# Patient Record
Sex: Male | Born: 1945 | Race: White | Hispanic: No | Marital: Married | Smoking: Former smoker
Health system: Southern US, Community
[De-identification: ages and names within clinical notes are randomized; demographics above are authoritative.]

## PROBLEM LIST (undated history)

## (undated) DIAGNOSIS — T7840XA Allergy, unspecified, initial encounter: Secondary | ICD-10-CM

## (undated) DIAGNOSIS — I10 Essential (primary) hypertension: Secondary | ICD-10-CM

## (undated) DIAGNOSIS — G4730 Sleep apnea, unspecified: Secondary | ICD-10-CM

## (undated) DIAGNOSIS — E785 Hyperlipidemia, unspecified: Secondary | ICD-10-CM

## (undated) DIAGNOSIS — K219 Gastro-esophageal reflux disease without esophagitis: Secondary | ICD-10-CM

## (undated) DIAGNOSIS — E119 Type 2 diabetes mellitus without complications: Secondary | ICD-10-CM

## (undated) DIAGNOSIS — M1990 Unspecified osteoarthritis, unspecified site: Secondary | ICD-10-CM

## (undated) DIAGNOSIS — I2510 Atherosclerotic heart disease of native coronary artery without angina pectoris: Secondary | ICD-10-CM

## (undated) HISTORY — DX: Type 2 diabetes mellitus without complications: E11.9

## (undated) HISTORY — DX: Allergy, unspecified, initial encounter: T78.40XA

## (undated) HISTORY — PX: TONSILLECTOMY: SUR1361

## (undated) HISTORY — PX: FRACTURE SURGERY: SHX138

## (undated) HISTORY — DX: Atherosclerotic heart disease of native coronary artery without angina pectoris: I25.10

## (undated) HISTORY — DX: Hyperlipidemia, unspecified: E78.5

## (undated) HISTORY — PX: COLONOSCOPY: SHX174

## (undated) HISTORY — PX: KNEE ARTHROSCOPY: SUR90

## (undated) HISTORY — PX: CARDIAC CATHETERIZATION: SHX172

## (undated) HISTORY — PX: OTHER SURGICAL HISTORY: SHX169

---

## 2000-02-14 ENCOUNTER — Encounter: Payer: Self-pay | Admitting: Internal Medicine

## 2000-02-14 ENCOUNTER — Ambulatory Visit (HOSPITAL_COMMUNITY): Admission: RE | Admit: 2000-02-14 | Discharge: 2000-02-14 | Payer: Self-pay | Admitting: Internal Medicine

## 2002-11-12 ENCOUNTER — Encounter: Admission: RE | Admit: 2002-11-12 | Discharge: 2002-11-12 | Payer: Self-pay | Admitting: Internal Medicine

## 2002-11-12 ENCOUNTER — Encounter: Payer: Self-pay | Admitting: Internal Medicine

## 2003-08-25 ENCOUNTER — Ambulatory Visit (HOSPITAL_COMMUNITY): Admission: RE | Admit: 2003-08-25 | Discharge: 2003-08-25 | Payer: Self-pay | Admitting: Internal Medicine

## 2004-02-09 ENCOUNTER — Encounter: Admission: RE | Admit: 2004-02-09 | Discharge: 2004-02-09 | Payer: Self-pay | Admitting: Internal Medicine

## 2006-06-22 ENCOUNTER — Ambulatory Visit: Payer: Self-pay | Admitting: Internal Medicine

## 2006-07-15 ENCOUNTER — Ambulatory Visit (HOSPITAL_BASED_OUTPATIENT_CLINIC_OR_DEPARTMENT_OTHER): Admission: RE | Admit: 2006-07-15 | Discharge: 2006-07-15 | Payer: Self-pay | Admitting: Internal Medicine

## 2006-07-22 ENCOUNTER — Ambulatory Visit: Payer: Self-pay | Admitting: Internal Medicine

## 2006-07-27 ENCOUNTER — Ambulatory Visit: Payer: Self-pay | Admitting: Internal Medicine

## 2006-08-31 ENCOUNTER — Ambulatory Visit: Payer: Self-pay | Admitting: Internal Medicine

## 2008-02-09 ENCOUNTER — Encounter: Payer: Self-pay | Admitting: Internal Medicine

## 2008-02-21 ENCOUNTER — Ambulatory Visit: Payer: Self-pay | Admitting: Internal Medicine

## 2008-02-21 DIAGNOSIS — K219 Gastro-esophageal reflux disease without esophagitis: Secondary | ICD-10-CM | POA: Insufficient documentation

## 2008-02-21 DIAGNOSIS — J3089 Other allergic rhinitis: Secondary | ICD-10-CM

## 2008-02-21 DIAGNOSIS — G4733 Obstructive sleep apnea (adult) (pediatric): Secondary | ICD-10-CM

## 2008-02-21 DIAGNOSIS — J302 Other seasonal allergic rhinitis: Secondary | ICD-10-CM

## 2008-03-23 ENCOUNTER — Telehealth (INDEPENDENT_AMBULATORY_CARE_PROVIDER_SITE_OTHER): Payer: Self-pay | Admitting: *Deleted

## 2008-08-21 ENCOUNTER — Ambulatory Visit: Payer: Self-pay | Admitting: Internal Medicine

## 2010-09-30 NOTE — Assessment & Plan Note (Signed)
[facility]                             PULMONARY OFFICE NOTE   SHAYA, ALTAMURA                   MRN:          [id number]  DATE:07/27/2006                            DOB:          09-18-1945    PROBLEM:  1. Obstructive sleep apnea.  2. Allergic rhinitis.  3. Possible esophageal reflux.   HISTORY:  He returns after his sleep study for follow up.  He was very  sensitive to extraneous noise, which I think says more about him than it  does about the sleep environment there which is really pretty quiet.  He  demonstrated moderate obstructive apnea with an index of 36.7 per hour,  moderate snoring with desaturation to 80 or 81%.  Because of his short  sleep time and delayed onset of events, he did not meet protocol  requirements for CPAP titration by split protocol.   MEDICATIONS:  1. Tenoretic.  2. Flonase.   ALLERGIES:  No medication allergy.   OBJECTIVE:  Weight 207 pounds, blood pressure 128/80, pulse regular 55,  room air saturation 96%.  He was more alert and interactive seeming on this visit.  Asking good  questions.  Nasal airway was not obstructed.  Pulse was regular, breathing unlabored.   IMPRESSION:  Moderate obstructive sleep apnea with an index of 36.7.   PLAN:  We discussed again the basics of sleep apnea, available  therapies, the importance of weight gain and his responsibility to drive  safely.  We are scheduling a CPAP titration and he will return after  that is completed.     Clinton D. Maple Hudson, MD, Tonny Bollman, FACP  Electronically Signed    CDY/MedQ  DD: 07/29/2006  DT: 07/30/2006  Job #: [id number]   cc:   Erskine Speed, M.D.

## 2010-09-30 NOTE — Assessment & Plan Note (Signed)
[facility]                             PULMONARY OFFICE NOTE   IKTAN, AIKMAN                   MRN:          [id number]  DATE:08/31/2006                            DOB:          Aug 09, 1945    PROBLEM LIST:  1. Obstructive sleep apnea.  2. Allergic rhinitis.  3. Possible esophageal reflux.   HISTORY:  He is still waiting for the home care company to switch him  from auto to fixed CPAP.  We discussed this.  With CPAP he is sleeping  more on his back.  He does admit to less daytime sleepiness.  He still  has a lifestyle habit of going to bed at 12:30 or 1 o'clock, getting up  around 8 with nocturia times 2 or 3.  Sleep position has been dictated  some by chronic knee and back pains.  He is using Flonase regularly,  trying to stay ahead of seasonal pollens.   MEDICATIONS:  1. Tenoretic.  2. Flonase.  3. CPAP.   No medication allergy.   OBJECTIVE:  Weight 211 pounds, BP 120/80, pulse regular 52, room air  saturation 94%.  He is alert.  Stocky body build.  No pressure marks on his face from the  mask.  No nasal congestion.   IMPRESSION:  Obstructive sleep apnea should respond comfortably to  continuous positive airway pressure.  His sleep is going to be affected  some by musculoskeletal pain from his back and knee problems.  Weight  loss is encouraged.   PLAN:  1. Sleep hygiene and a review of sleep therapies.  2. Will maintain for a while on continuous positive airway pressure at      11 CWP, calling for help if needed, otherwise scheduling return in      6 months.  He is given sample Astelin for trial, once each nostril      at [facility].n.     Clinton D. Maple Hudson, MD, Tonny Bollman, FACP  Electronically Signed    CDY/MedQ  DD: 08/31/2006  DT: 09/01/2006  Job #: [id number]   cc:   Erskine Speed, M.D.

## 2010-09-30 NOTE — Assessment & Plan Note (Signed)
[facility]                             PULMONARY OFFICE NOTE   Harry Leach, Harry Leach                   MRN:          [id number]  DATE:06/22/2006                            DOB:          1945/07/21    PROBLEM:  A 65 year old man referred through the courtesy of Dr. Nila Nephew in Sleep Medicine Consultation, concerned about snoring and sleep  disordered breathing.   HISTORY:  His family has been telling him for years that he snores  extremely loudly.  His wife gave up and went to a separate room quite  some time ago.  Sleep is never restful.  He is aware that he tosses and  turns, wakes briefly repeatedly through the night, and can be tired if  he has to sit quietly in the daytime.  He is not aware of apnea.  Bedtime is around 1 a.m., estimating 5 minutes before sleep onset, and  waking as many as 3 times a night, specifically for the bathroom before  finally up at 8 a.m.   MEDICATIONS:  Tenoretic, Flonase.   NO MEDICATION ALLERGY.   REVIEW OF SYSTEMS:  Snoring.  Legs sometimes feel crawling sensation  before sleep onset.  Indigestion.  Bruxism.  Mild prostatism symptoms.  Sleep position if affected by pain in arthritic knee and by sciatica.  Because of these, he tends to sleep best on his sides.   PAST HISTORY:  Tonsillectomy.  Remote allergy vaccine for seasonal  allergies.  Remote episode of pleurisy.  Hypertension.  No history of  thyroid, heart or lung disease.  Surgery x3 on his left knee.  Bothersome sciatic pain and postnasal drainage.   SOCIAL HISTORY:  Quit smoking 20 years ago.  Drinks 3 beers per week.  Married with children.  Works as an Pensions consultant.   FAMILY HISTORY:  Father with heart disease and prostate cancer.  Nobody  known to have sleep apnea.   OBJECTIVE:  Weight 207 pounds.  BP 136/80.  Pulse regular 48.  Room air  saturation 97%.  Alert, calm gentleman, stocky body build.  HEENT:  Palate spacing 2 to 3/4.  Voice  quality normal.  No thyromegaly  or stridor.  There was bilateral turbinate edema with some mucus  crusting.  CHEST:  Quiet, clear lung fields.  Unlabored breathing.  HEART SOUNDS:  Regular without murmur or gallop.  EXTREMITIES:  No tremor, cyanosis, clubbing or edema.   IMPRESSION:  1. Snoring with high probability of obstructive sleep apnea.  2. Allergic rhinitis.  3. Symptoms suggestive of esophageal reflux.   PLAN:  We are scheduled a split-protocol nocturnal polysomnogram at the  [facility] and office return to follow after  that.  We have had preliminary discussion of the basics of obstructive  sleep apnea, and the medical concerns.  I appreciate the chance to meet  him.     Clinton D. Maple Hudson, MD, Tonny Bollman, FACP  Electronically Signed    CDY/MedQ  DD: 06/23/2006  DT: 06/23/2006  Job #: [id number]   cc:   Erskine Speed, M.D.  [facility]

## 2010-09-30 NOTE — Procedures (Signed)
NAME:  Harry Leach, Harry Leach            ACCOUNT NO.:  [id number]   MEDICAL RECORD NO.:  [id number]          PATIENT TYPE:  OUT   LOCATION:  SLEEP CENTER                 FACILITY:  [facility]   PHYSICIAN:  Clinton D. Maple Hudson, MD, FCCP, FACPDATE OF BIRTH:  1945-07-04   DATE OF STUDY:  07/15/2006                            NOCTURNAL POLYSOMNOGRAM   INDICATION. FOR STUDY:  Hypersomnia with sleep apnea.   EPWORTH SLEEPINESS SCORE:   MEDICATIONS:  Flonase and atenolol, Phazyme.   SLEEP ARCHITECTURE:  Short total sleep time 104 minutes with sleep  efficiency 27%, stage I was 25%, stage II 63%, stages III and IV absent.  REM was 12% of total sleep time.  Sleep latency 57 minutes, REM latency  179 minutes, awake after sleep onset 222 minutes, arousal index 9.2.  Phazyme was taken at 3:25 a.m.  He complained of noise in his room and  wore ear muffs to block this after about 3:50 a.m.  He was awake for  most of the time between midnight and 3:50 a.m., described as restless.  He said that air/heat made a clicking noise that was disturbing and that  overall quality of sleep was much worse than usual.   RESPIRATORY DATA:  Apnea/hypopnea index (AHI, RDI) 36.7 obstructive  events per hour, indicating moderate obstructive sleep apnea/hypopnea  syndrome.  There were 52 obstructive apneas and 12 hypopneas.  Events  were not positional.  REM AHI 72.  There was insufficient sleep to  qualify for split protocol CPAP titration on this study night.   OXYGEN DATA:  Moderately loud snoring with oxygen desaturation to a  nadir of 81%.  Mean oxygen saturation through the study was 93% on room  air.   CARDIAC DATA:  Sinus rhythm with sinus bradycardia to 49 per minute.   MOVEMENT-PARASOMNIA:  No significant limb jerks or other motor  disturbance.  Bathroom x2.   IMPRESSIONS-RECOMMENDATIONS:  1. Short total sleep time.  He was disturbed by extraneous noise in      the room and restless.  2. Moderate obstructive  sleep apnea/hypopnea syndrome, apnea-hypopnea      index 36.7 per hour with non-positional events, moderately louse      snoring and oxygen desaturation to a nadir of 81%.  3. Consider continuous positive airway pressure titration and      discussion of effects of sleep disturbing factors in the home      environment.      Clinton D. Maple Hudson, MD, [facility], FACP  Diplomate, Biomedical engineer of Sleep Medicine  Electronically Signed     CDY/MEDQ  D:  07/22/2006 09:05:00  T:  07/22/2006 10:12:03  Job:  [id number]

## 2011-05-13 ENCOUNTER — Other Ambulatory Visit: Payer: Self-pay | Admitting: Orthopedic Surgery

## 2011-07-04 ENCOUNTER — Encounter (HOSPITAL_COMMUNITY): Payer: Self-pay | Admitting: Pharmacy Technician

## 2011-07-04 NOTE — H&P (Signed)
Harry Leach DOB: 07/28/1945   Chief Complaint: left knee pain  History of Present Illness The patient is a 66 year old male who comes in today for a preoperative History and Physical. The patient is scheduled for a left total knee arthroplasty to be performed by Dr. Gus Leach. [name], MD at [facility] on Thursday July 13, 2011 . The patient presented with left knee symptoms including pain which began 2 years (flare up in [place], after pivoting on a ladder). He has knee pain ,trouble with stairs, swelling (noticed initially in September) and stiffness (after tennis even with an unloader brace). Harry Leach states that the knee does hurt at most times. He is still able to play tennis once a week but he is limited in things that he would like to do more frequently. He is not getting much swelling. The knee is not giving out on him. He occasionally will have some mild right knee pain. He is not getting any lower extremity weakness or paresthesias or having any hip or back pain. He has had injections in the knee in the past. He was seen by Dr. Marzella Leach at [facility] a couple of years ago and told he had significant arthritis and would eventually need the knee replaced. AP both knees and lateral show advanced endstage arthritis of the left knee, bone on bone medial and patellofemoral, large osteophyte formation throughout. His right knee is essentially normal with just minimal medial joint space narrowing. PCP: Dr. Elmore Leach    Past Medical History  Osteoarthritis, Knee (715.96) Sleep Apnea. CPAP Hypertension Shingles Enlarged prostate  Allergies No Known Drug Allergies. 05/02/2011   Family History Cancer. father, grandmother mothers side and grandmother fathers side Cerebrovascular Accident. mother Congestive Heart Failure. father and grandmother mothers side Osteoporosis. mother Diabetes Mellitus. mother Heart Disease. father and grandfather mothers  side Hypertension. mother and father Father. multiple MI, prostate cancer. deceased age 23 Mother. Parkinson's, dementia. deceased age 66   Social History Drug/Alcohol Rehab (Previously). no Exercise. Exercises weekly; does other Illicit drug use. no Children. 2 Current work status. working full time Drug/Alcohol Rehab (Currently). no Living situation. live with spouse Tobacco / smoke exposure. no Tobacco use. former smoker; smoke(d) 1 pack(s) per week Marital status. married Number of flights of stairs before winded. 4-5 Pain Contract. no Alcohol use. current drinker; drinks beer; only occasionally per week Caregiver. wife, children Advance Directives. Living will   Medication History Atenolol-Chlorthalidone (100-25MG  Tablet, Oral) Active. Omeprazole (20mg ) Specific dose unknown - Active. Hydromet (5-1.5MG /5ML Syrup, Oral) Active. Fluticasone Propionate (50MCG/ACT Suspension, Nasal as needed) Active. Finasteride (5MG  Tablet, Oral) Active. Tamsulosin HCl (0.4MG  Capsule, Oral two times daily) Active. Azelastine HCl (137MCG/SPRAY Solution, Nasal as needed) Active. Multivitamins ( Oral) Active. Lactose Fast Acting Relief (9000UNIT Tablet, Oral) Active.   Past Surgical History Arthroscopy of Knee. 1982 left ACL, [id number], 1999 Colon Polyp Removal - Colonoscopy Tonsillectomy. 1955   Diagnostic Studies History EKG. June 2012   Review of Systems General:Not Present- Chills, Fever, Night Sweats, Fatigue, Weight Gain, Weight Loss and Memory Loss. Skin:Not Present- Hives, Itching, Rash, Eczema and Lesions. HEENT:Not Present- Tinnitus, Headache, Double Vision, Visual Loss, Hearing Loss and Dentures. Respiratory:Not Present- Shortness of breath with exertion, Shortness of breath at rest, Allergies, Coughing up blood and Chronic Cough. Cardiovascular:Not Present- Chest Pain, Racing/skipping heartbeats, Difficulty Breathing Lying Down, Murmur,  Swelling and Palpitations. Gastrointestinal:Not Present- Bloody Stool, Heartburn, Abdominal Pain, Vomiting, Nausea, Constipation, Diarrhea, Difficulty Swallowing, Jaundice and Loss of appetitie. Male Genitourinary:Present-  Nocturia. Not Present- Urinary frequency, Blood in Urine, Weak urinary stream, Discharge, Flank Pain, Incontinence, Painful Urination, Urgency, Urinary Retention and Urinating at Night. Musculoskeletal:Present- Joint Pain and Back Pain (sciatica). Not Present- Muscle Weakness, Muscle Pain, Joint Swelling, Morning Stiffness and Spasms. Neurological:Present- Tremor (rarely in finger). Not Present- Dizziness, Blackout spells, Paralysis, Difficulty with balance and Weakness. Psychiatric:Not Present- Insomnia.   Vitals 07/04/2011 3:59 PM Weight: 203 lb Height: 67 in Body Surface Area: 2.09 m Body Mass Index: 31.79 kg/m Pulse: 64 (Regular) Resp.: 18 (Unlabored) BP: 121/78 (Sitting, Left Arm, Standard)    Physical Exam General Mental Status - Alert, cooperative and good historian. General Appearance- pleasant. Not in acute distress. Orientation- Oriented X3. Build & Nutrition- Overweight, Well nourished and Well developed. Head and Neck Head- normocephalic, atraumatic . NeckGlobal Assessment- supple. no bruit auscultated on the right and no bruit auscultated on the left. Eye Pupil- Bilateral- PERRLA. Motion- Bilateral- EOMI. Chest and Lung Exam Auscultation: Breath sounds:- clear at anterior chest wall and - clear at posterior chest wall. Adventitious sounds:- No Adventitious sounds. Cardiovascular Auscultation:Rhythm- Regular rate and rhythm. Heart Sounds- S1 WNL and S2 WNL. Murmurs & Other Heart Sounds:Auscultation of the heart reveals - No Murmurs. Abdomen Palpation/Percussion:Tenderness- Abdomen is non-tender to palpation. Rigidity (guarding)- Abdomen is soft. Auscultation:Auscultation of the abdomen reveals - Bowel  sounds normal. Male Genitourinary Not done, not pertinent to present illness Neurologic Examination of related systems reveals - normal muscle strength and tone in all extremities. Neurologic evaluation reveals - normal sensation and upper and lower extremity deep tendon reflexes intact bilaterally . Musculoskeletal Right knee no effusion. Slight varus range 0 to 135. Slight crepitus on range of motion, no tenderness or instability. Left knee significant varus range 5 to 120. Marked crepitus on range of motion. Tenderness medial greater than lateral with no instability noted. Gait pattern shows slight varus thrust on the left.   Assessment & Plan Osteoarthritis, Knee (715.96) Left Knee Total Arthroplasty  Dimitri Ped, PA-C

## 2011-07-05 ENCOUNTER — Other Ambulatory Visit (HOSPITAL_COMMUNITY): Payer: BC Managed Care – PPO

## 2011-07-05 ENCOUNTER — Ambulatory Visit (HOSPITAL_COMMUNITY)
Admission: RE | Admit: 2011-07-05 | Discharge: 2011-07-05 | Disposition: A | Discharge status: Home / Self Care | Payer: BC Managed Care – PPO | Source: Ambulatory Visit | Attending: Orthopedic Surgery | Admitting: Orthopedic Surgery

## 2011-07-05 ENCOUNTER — Encounter (HOSPITAL_COMMUNITY): Payer: Self-pay

## 2011-07-05 ENCOUNTER — Encounter (HOSPITAL_COMMUNITY)
Admission: RE | Admit: 2011-07-05 | Discharge: 2011-07-05 | Disposition: A | Discharge status: Home / Self Care | Payer: BC Managed Care – PPO | Source: Ambulatory Visit | Attending: Orthopedic Surgery | Admitting: Orthopedic Surgery

## 2011-07-05 DIAGNOSIS — Z79899 Other long term (current) drug therapy: Secondary | ICD-10-CM | POA: Insufficient documentation

## 2011-07-05 DIAGNOSIS — I10 Essential (primary) hypertension: Secondary | ICD-10-CM | POA: Insufficient documentation

## 2011-07-05 DIAGNOSIS — M1710 Unilateral primary osteoarthritis, unspecified knee: Secondary | ICD-10-CM | POA: Insufficient documentation

## 2011-07-05 DIAGNOSIS — I517 Cardiomegaly: Secondary | ICD-10-CM | POA: Insufficient documentation

## 2011-07-05 DIAGNOSIS — Z01812 Encounter for preprocedural laboratory examination: Secondary | ICD-10-CM | POA: Insufficient documentation

## 2011-07-05 DIAGNOSIS — K219 Gastro-esophageal reflux disease without esophagitis: Secondary | ICD-10-CM | POA: Insufficient documentation

## 2011-07-05 DIAGNOSIS — G4730 Sleep apnea, unspecified: Secondary | ICD-10-CM | POA: Insufficient documentation

## 2011-07-05 DIAGNOSIS — Z01818 Encounter for other preprocedural examination: Secondary | ICD-10-CM | POA: Insufficient documentation

## 2011-07-05 HISTORY — DX: Sleep apnea, unspecified: G47.30

## 2011-07-05 HISTORY — DX: Unspecified osteoarthritis, unspecified site: M19.90

## 2011-07-05 HISTORY — DX: Essential (primary) hypertension: I10

## 2011-07-05 HISTORY — DX: Gastro-esophageal reflux disease without esophagitis: K21.9

## 2011-07-05 LAB — SURGICAL PCR SCREEN
MRSA, PCR: NEGATIVE
Staphylococcus aureus: NEGATIVE

## 2011-07-05 LAB — COMPREHENSIVE METABOLIC PANEL
AST: 31 U/L (ref 0–37)
Albumin: 4.2 g/dL (ref 3.5–5.2)
Calcium: 9.8 mg/dL (ref 8.4–10.5)
Chloride: 100 mEq/L (ref 96–112)
Creatinine, Ser: 1.46 mg/dL — ABNORMAL HIGH (ref 0.50–1.35)
Total Bilirubin: 0.5 mg/dL (ref 0.3–1.2)
Total Protein: 7.2 g/dL (ref 6.0–8.3)

## 2011-07-05 LAB — URINALYSIS, ROUTINE W REFLEX MICROSCOPIC
Leukocytes, UA: NEGATIVE
Nitrite: NEGATIVE
Specific Gravity, Urine: 1.028 (ref 1.005–1.030)
Urobilinogen, UA: 0.2 mg/dL (ref 0.0–1.0)
pH: 6 (ref 5.0–8.0)

## 2011-07-05 LAB — CBC
MCHC: 35.9 g/dL (ref 30.0–36.0)
MCV: 85.5 fL (ref 78.0–100.0)
Platelets: 187 10*3/uL (ref 150–400)
RDW: 13.8 % (ref 11.5–15.5)
WBC: 9.3 10*3/uL (ref 4.0–10.5)

## 2011-07-05 LAB — PROTIME-INR: INR: 0.95 (ref 0.00–1.49)

## 2011-07-05 MED ORDER — CHLORHEXIDINE GLUCONATE 4 % EX LIQD
60.0000 mL | Freq: Once | CUTANEOUS | Status: DC
  Filled 2011-07-05: qty 60

## 2011-07-05 NOTE — Pre-Procedure Instructions (Signed)
States had PLEURISY in his 28's

## 2011-07-05 NOTE — Patient Instructions (Addendum)
[name]  07/05/2011   Your procedure is scheduled on:  07/13/11   Thursday   Surgery [id number]  Report to [facility] at    1045   AM.  Call this number if you have problems the morning of surgery: [phone]     Or PST   [id number]  Janelis Stelzer              BRING CPAP MASK WITH YOU TO HOSPITAL  Remember:   Do not eat food:After Midnight.   Wednesday night  May have clear liquids: until midnight Wednesday night  Clear liquids include soda, tea, black coffee, apple or grape juice, broth.  Take these medicines the morning of surgery with A SIP OF WATER:    Prolisec with sip water  Do not wear jewelry, make-up or nail polish.  Do not wear lotions, powders, or perfumes. You may wear deodorant.  Do not shave 48 hours prior to surgery.  Do not bring valuables to the hospital.  Contacts, dentures or bridgework may not be worn into surgery.  Leave suitcase in the car. After surgery it may be brought to your room.  For patients admitted to the hospital, checkout time is 11:00 AM the day of discharge.   Patients discharged the day of surgery will not be allowed to drive home.  Name and phone number of your driver:    wife                                                                  Special Instructions: CHG Shower Use Special Wash: 1/2 bottle night before surgery and 1/2 bottle morning of surgery. REGULAR SOAP FACE AND PRIVATES                            MEN-MAY SHAVE FACE MORNING OF SURGERY  Please read over the following fact sheets that you were given: MRSA Information

## 2011-07-06 NOTE — Pre-Procedure Instructions (Signed)
0830- was transferred to [place] at [facility] ext [phone]- left message regarding abnormal CMET and request for review per provider.  Notifed Avel Peace PA of abnormal potassium, craetnine and BUN

## 2011-07-09 ENCOUNTER — Other Ambulatory Visit: Payer: Self-pay | Admitting: Orthopedic Surgery

## 2011-07-10 NOTE — Pre-Procedure Instructions (Signed)
Notified patient of surgery change date and time of OR-   To be short stay at 0915. Verbalized understanding and NPO after midnight except meds as previously instructed

## 2011-07-10 NOTE — Pre-Procedure Instructions (Signed)
Left voice mail on cell phone AM  Of surgery change and times. Left second message on cell phone and at home of the above and to veriiy he received the message.  1650 Left message with Lillia Abed at office for Mr Worrel to review messages and return my call

## 2011-07-12 ENCOUNTER — Encounter (HOSPITAL_COMMUNITY): Payer: Self-pay | Admitting: Anesthesiology

## 2011-07-12 ENCOUNTER — Inpatient Hospital Stay (HOSPITAL_COMMUNITY): Payer: BC Managed Care – PPO | Admitting: Anesthesiology

## 2011-07-12 ENCOUNTER — Encounter (HOSPITAL_COMMUNITY): Payer: Self-pay | Admitting: *Deleted

## 2011-07-12 ENCOUNTER — Encounter (HOSPITAL_COMMUNITY)
Admission: RE | Disposition: A | Discharge status: Home Health | Payer: Self-pay | Source: Ambulatory Visit | Attending: Orthopedic Surgery

## 2011-07-12 ENCOUNTER — Inpatient Hospital Stay (HOSPITAL_COMMUNITY)
Admission: RE | Admit: 2011-07-12 | Discharge: 2011-07-15 | DRG: 209 | Disposition: A | Discharge status: Home Health | Payer: BC Managed Care – PPO | Source: Ambulatory Visit | Attending: Orthopedic Surgery | Admitting: Orthopedic Surgery

## 2011-07-12 DIAGNOSIS — G4730 Sleep apnea, unspecified: Secondary | ICD-10-CM | POA: Diagnosis present

## 2011-07-12 DIAGNOSIS — Z96659 Presence of unspecified artificial knee joint: Secondary | ICD-10-CM

## 2011-07-12 DIAGNOSIS — K219 Gastro-esophageal reflux disease without esophagitis: Secondary | ICD-10-CM | POA: Diagnosis present

## 2011-07-12 DIAGNOSIS — E876 Hypokalemia: Secondary | ICD-10-CM | POA: Diagnosis not present

## 2011-07-12 DIAGNOSIS — M1710 Unilateral primary osteoarthritis, unspecified knee: Secondary | ICD-10-CM | POA: Diagnosis present

## 2011-07-12 DIAGNOSIS — D62 Acute posthemorrhagic anemia: Secondary | ICD-10-CM | POA: Diagnosis not present

## 2011-07-12 DIAGNOSIS — I10 Essential (primary) hypertension: Secondary | ICD-10-CM | POA: Diagnosis present

## 2011-07-12 HISTORY — PX: TOTAL KNEE ARTHROPLASTY: SHX125

## 2011-07-12 LAB — TYPE AND SCREEN: ABO/RH(D): O POS

## 2011-07-12 SURGERY — ARTHROPLASTY, KNEE, TOTAL
Anesthesia: Spinal | Site: Knee | Laterality: Left | Wound class: Clean

## 2011-07-12 MED ORDER — MORPHINE SULFATE (PF) 1 MG/ML IV SOLN
INTRAVENOUS | Status: DC
  Administered 2011-07-12: 15:00:00 via INTRAVENOUS
  Filled 2011-07-12: qty 25

## 2011-07-12 MED ORDER — CEFAZOLIN SODIUM 1-5 GM-% IV SOLN
1.0000 g | Freq: Four times a day (QID) | INTRAVENOUS | Status: AC
  Administered 2011-07-12 – 2011-07-13 (×3): 1 g via INTRAVENOUS
  Filled 2011-07-12 (×3): qty 50

## 2011-07-12 MED ORDER — BISACODYL 10 MG RE SUPP: 10.0000 mg | Freq: Every day | RECTAL | Status: DC | PRN

## 2011-07-12 MED ORDER — FLEET ENEMA 7-19 GM/118ML RE ENEM: 1.0000 | ENEMA | Freq: Once | RECTAL | Status: AC | PRN

## 2011-07-12 MED ORDER — WARFARIN VIDEO: Freq: Once | Status: DC

## 2011-07-12 MED ORDER — POTASSIUM CHLORIDE IN NACL 20-0.9 MEQ/L-% IV SOLN
INTRAVENOUS | Status: DC
  Administered 2011-07-13: 20 mL via INTRAVENOUS
  Administered 2011-07-13: via INTRAVENOUS
  Filled 2011-07-12 (×3): qty 1000

## 2011-07-12 MED ORDER — CEFAZOLIN SODIUM-DEXTROSE 2-3 GM-% IV SOLR
2.0000 g | Freq: Once | INTRAVENOUS | Status: AC
  Administered 2011-07-12: 2 g via INTRAVENOUS

## 2011-07-12 MED ORDER — MENTHOL 3 MG MT LOZG
1.0000 | LOZENGE | OROMUCOSAL | Status: DC | PRN
  Filled 2011-07-12: qty 9

## 2011-07-12 MED ORDER — WARFARIN SODIUM 7.5 MG PO TABS
7.5000 mg | ORAL_TABLET | Freq: Once | ORAL | Status: AC
  Administered 2011-07-12: 7.5 mg via ORAL
  Filled 2011-07-12: qty 1

## 2011-07-12 MED ORDER — SODIUM CHLORIDE 0.9 % IR SOLN
Status: DC | PRN
  Administered 2011-07-12: 1000 mL

## 2011-07-12 MED ORDER — PHENOL 1.4 % MT LIQD
1.0000 | OROMUCOSAL | Status: DC | PRN
  Filled 2011-07-12: qty 177

## 2011-07-12 MED ORDER — OXYCODONE HCL 5 MG PO TABS
5.0000 mg | ORAL_TABLET | ORAL | Status: DC | PRN
  Administered 2011-07-13 – 2011-07-15 (×10): 10 mg via ORAL
  Filled 2011-07-12 (×10): qty 2

## 2011-07-12 MED ORDER — CHLORTHALIDONE 25 MG PO TABS
25.0000 mg | ORAL_TABLET | Freq: Every day | ORAL | Status: DC
  Administered 2011-07-13 – 2011-07-15 (×3): 25 mg via ORAL
  Filled 2011-07-12 (×3): qty 1

## 2011-07-12 MED ORDER — SODIUM CHLORIDE 0.9 % IR SOLN
Status: DC | PRN
  Administered 2011-07-12: 3000 mL

## 2011-07-12 MED ORDER — DEXAMETHASONE SODIUM PHOSPHATE 10 MG/ML IJ SOLN
10.0000 mg | Freq: Once | INTRAMUSCULAR | Status: AC
  Administered 2011-07-12: 10 mg via INTRAVENOUS
  Filled 2011-07-12: qty 1

## 2011-07-12 MED ORDER — MEPERIDINE HCL 50 MG/ML IJ SOLN: 6.2500 mg | INTRAMUSCULAR | Status: DC | PRN

## 2011-07-12 MED ORDER — METHOCARBAMOL 500 MG PO TABS
500.0000 mg | ORAL_TABLET | Freq: Four times a day (QID) | ORAL | Status: DC | PRN
  Administered 2011-07-14 – 2011-07-15 (×3): 500 mg via ORAL
  Filled 2011-07-12 (×3): qty 1

## 2011-07-12 MED ORDER — HETASTARCH-ELECTROLYTES 6 % IV SOLN
INTRAVENOUS | Status: DC | PRN
  Administered 2011-07-12: 13:00:00 via INTRAVENOUS

## 2011-07-12 MED ORDER — DOCUSATE SODIUM 100 MG PO CAPS
100.0000 mg | ORAL_CAPSULE | Freq: Two times a day (BID) | ORAL | Status: DC
  Administered 2011-07-12 – 2011-07-15 (×6): 100 mg via ORAL
  Filled 2011-07-12 (×7): qty 1

## 2011-07-12 MED ORDER — BUPIVACAINE 0.25 % ON-Q PUMP DUAL CATH 300 ML
INJECTION | Status: DC | PRN
  Administered 2011-07-12: 300 mL

## 2011-07-12 MED ORDER — FLUTICASONE PROPIONATE 50 MCG/ACT NA SUSP
2.0000 | Freq: Every day | NASAL | Status: DC | PRN
  Filled 2011-07-12: qty 16

## 2011-07-12 MED ORDER — ACETAMINOPHEN 650 MG RE SUPP: 650.0000 mg | Freq: Four times a day (QID) | RECTAL | Status: DC | PRN

## 2011-07-12 MED ORDER — DIPHENHYDRAMINE HCL 12.5 MG/5ML PO ELIX: 12.5000 mg | ORAL_SOLUTION | ORAL | Status: DC | PRN

## 2011-07-12 MED ORDER — ACETAMINOPHEN 10 MG/ML IV SOLN
1000.0000 mg | Freq: Once | INTRAVENOUS | Status: AC
  Administered 2011-07-12: 1000 mg via INTRAVENOUS
  Filled 2011-07-12: qty 100

## 2011-07-12 MED ORDER — ACETAMINOPHEN 325 MG PO TABS: 650.0000 mg | ORAL_TABLET | Freq: Four times a day (QID) | ORAL | Status: DC | PRN

## 2011-07-12 MED ORDER — ONDANSETRON HCL 4 MG/2ML IJ SOLN
INTRAMUSCULAR | Status: DC | PRN
  Administered 2011-07-12: 4 mg via INTRAVENOUS

## 2011-07-12 MED ORDER — AZELASTINE HCL 0.1 % NA SOLN
1.0000 | Freq: Two times a day (BID) | NASAL | Status: DC | PRN
  Filled 2011-07-12: qty 30

## 2011-07-12 MED ORDER — LACTATED RINGERS IV SOLN: INTRAVENOUS | Status: DC

## 2011-07-12 MED ORDER — ATENOLOL 100 MG PO TABS
100.0000 mg | ORAL_TABLET | Freq: Every day | ORAL | Status: DC
  Administered 2011-07-13 – 2011-07-15 (×3): 100 mg via ORAL
  Filled 2011-07-12 (×3): qty 1

## 2011-07-12 MED ORDER — ATENOLOL-CHLORTHALIDONE 100-25 MG PO TABS: 1.0000 | ORAL_TABLET | Freq: Every day | ORAL | Status: DC

## 2011-07-12 MED ORDER — FINASTERIDE 5 MG PO TABS
5.0000 mg | ORAL_TABLET | Freq: Every day | ORAL | Status: DC
  Administered 2011-07-12 – 2011-07-15 (×4): 5 mg via ORAL
  Filled 2011-07-12 (×4): qty 1

## 2011-07-12 MED ORDER — NALOXONE HCL 0.4 MG/ML IJ SOLN: 0.4000 mg | INTRAMUSCULAR | Status: DC | PRN

## 2011-07-12 MED ORDER — SODIUM CHLORIDE 0.9 % IJ SOLN: 9.0000 mL | INTRAMUSCULAR | Status: DC | PRN

## 2011-07-12 MED ORDER — ONDANSETRON HCL 4 MG PO TABS: 4.0000 mg | ORAL_TABLET | Freq: Four times a day (QID) | ORAL | Status: DC | PRN

## 2011-07-12 MED ORDER — BUPIVACAINE ON-Q PAIN PUMP (FOR ORDER SET NO CHG)
INJECTION | Status: DC
  Filled 2011-07-12: qty 1

## 2011-07-12 MED ORDER — TEMAZEPAM 15 MG PO CAPS: 15.0000 mg | ORAL_CAPSULE | Freq: Every evening | ORAL | Status: DC | PRN

## 2011-07-12 MED ORDER — TAMSULOSIN HCL 0.4 MG PO CAPS
0.4000 mg | ORAL_CAPSULE | Freq: Two times a day (BID) | ORAL | Status: DC
Start: 2011-07-12 — End: 2011-07-15
  Administered 2011-07-12 – 2011-07-15 (×6): 0.4 mg via ORAL
  Filled 2011-07-12 (×7): qty 1

## 2011-07-12 MED ORDER — BUPIVACAINE 0.25 % ON-Q PUMP SINGLE CATH 300ML
300.0000 mL | INJECTION | Status: DC
Start: 2011-07-12 — End: 2011-07-12
  Filled 2011-07-12: qty 300

## 2011-07-12 MED ORDER — PANTOPRAZOLE SODIUM 40 MG PO TBEC
40.0000 mg | DELAYED_RELEASE_TABLET | Freq: Every day | ORAL | Status: DC
  Filled 2011-07-12 (×2): qty 1

## 2011-07-12 MED ORDER — POLYETHYLENE GLYCOL 3350 17 G PO PACK
17.0000 g | PACK | Freq: Every day | ORAL | Status: DC | PRN
  Filled 2011-07-12: qty 1

## 2011-07-12 MED ORDER — MIDAZOLAM HCL 5 MG/5ML IJ SOLN
INTRAMUSCULAR | Status: DC | PRN
  Administered 2011-07-12: 2 mg via INTRAVENOUS

## 2011-07-12 MED ORDER — CEFAZOLIN SODIUM 1-5 GM-% IV SOLN: 1.0000 g | INTRAVENOUS | Status: DC

## 2011-07-12 MED ORDER — MORPHINE SULFATE (PF) 1 MG/ML IV SOLN
INTRAVENOUS | Status: AC
  Filled 2011-07-12: qty 25

## 2011-07-12 MED ORDER — FENTANYL CITRATE 0.05 MG/ML IJ SOLN
INTRAMUSCULAR | Status: DC | PRN
  Administered 2011-07-12: 100 ug via INTRAVENOUS

## 2011-07-12 MED ORDER — COUMADIN BOOK
Freq: Once | Status: AC
  Administered 2011-07-13: 13:00:00
  Filled 2011-07-12: qty 1

## 2011-07-12 MED ORDER — ACETAMINOPHEN 10 MG/ML IV SOLN
1000.0000 mg | Freq: Four times a day (QID) | INTRAVENOUS | Status: AC
  Administered 2011-07-12 – 2011-07-13 (×4): 1000 mg via INTRAVENOUS
  Filled 2011-07-12 (×5): qty 100

## 2011-07-12 MED ORDER — DIPHENHYDRAMINE HCL 50 MG/ML IJ SOLN: 12.5000 mg | Freq: Four times a day (QID) | INTRAMUSCULAR | Status: DC | PRN

## 2011-07-12 MED ORDER — WARFARIN - PHARMACIST DOSING INPATIENT
Freq: Every day | Status: DC
  Filled 2011-07-12 (×4): qty 1

## 2011-07-12 MED ORDER — METOCLOPRAMIDE HCL 5 MG/ML IJ SOLN: 5.0000 mg | Freq: Three times a day (TID) | INTRAMUSCULAR | Status: DC | PRN

## 2011-07-12 MED ORDER — ONDANSETRON HCL 4 MG/2ML IJ SOLN: 4.0000 mg | Freq: Four times a day (QID) | INTRAMUSCULAR | Status: DC | PRN

## 2011-07-12 MED ORDER — ATENOLOL 100 MG PO TABS
100.0000 mg | ORAL_TABLET | Freq: Once | ORAL | Status: AC
  Administered 2011-07-12: 100 mg via ORAL
  Filled 2011-07-12: qty 1

## 2011-07-12 MED ORDER — LACTATED RINGERS IV SOLN
INTRAVENOUS | Status: DC
  Administered 2011-07-12: 13:00:00 via INTRAVENOUS
  Administered 2011-07-12: 1000 mL via INTRAVENOUS
  Administered 2011-07-12: 14:00:00 via INTRAVENOUS

## 2011-07-12 MED ORDER — BUPIVACAINE IN DEXTROSE 0.75-8.25 % IT SOLN
INTRATHECAL | Status: DC | PRN
  Administered 2011-07-12: 1.8 mL via INTRATHECAL

## 2011-07-12 MED ORDER — METHOCARBAMOL 100 MG/ML IJ SOLN
500.0000 mg | Freq: Four times a day (QID) | INTRAVENOUS | Status: DC | PRN
  Administered 2011-07-12: 500 mg via INTRAVENOUS
  Filled 2011-07-12: qty 5

## 2011-07-12 MED ORDER — HYDROMORPHONE HCL PF 1 MG/ML IJ SOLN: 0.2500 mg | INTRAMUSCULAR | Status: DC | PRN

## 2011-07-12 MED ORDER — PROMETHAZINE HCL 25 MG/ML IJ SOLN: 6.2500 mg | INTRAMUSCULAR | Status: DC | PRN

## 2011-07-12 MED ORDER — DIPHENHYDRAMINE HCL 12.5 MG/5ML PO ELIX
12.5000 mg | ORAL_SOLUTION | Freq: Four times a day (QID) | ORAL | Status: DC | PRN
  Filled 2011-07-12: qty 5

## 2011-07-12 MED ORDER — SODIUM CHLORIDE 0.9 % IV SOLN: INTRAVENOUS | Status: DC

## 2011-07-12 MED ORDER — PROPOFOL 10 MG/ML IV EMUL
INTRAVENOUS | Status: DC | PRN
  Administered 2011-07-12: 140 ug/kg/min via INTRAVENOUS

## 2011-07-12 MED ORDER — METOCLOPRAMIDE HCL 10 MG PO TABS: 5.0000 mg | ORAL_TABLET | Freq: Three times a day (TID) | ORAL | Status: DC | PRN

## 2011-07-12 SURGICAL SUPPLY — 54 items
BAG SPEC THK2 15X12 ZIP CLS (MISCELLANEOUS) ×1
BAG ZIPLOCK 12X15 (MISCELLANEOUS) ×2 IMPLANT
BANDAGE ELASTIC 6 VELCRO ST LF (GAUZE/BANDAGES/DRESSINGS) ×2 IMPLANT
BANDAGE ESMARK 6X9 LF (GAUZE/BANDAGES/DRESSINGS) ×1 IMPLANT
BLADE SAG 18X100X1.27 (BLADE) ×2 IMPLANT
BLADE SAW SGTL 11.0X1.19X90.0M (BLADE) ×2 IMPLANT
BNDG CMPR 9X6 STRL LF SNTH (GAUZE/BANDAGES/DRESSINGS) ×1
BNDG ESMARK 6X9 LF (GAUZE/BANDAGES/DRESSINGS) ×2
BOWL SMART MIX CTS (DISPOSABLE) ×2 IMPLANT
CATH KIT ON-Q SILVERSOAK 5 (CATHETERS) ×1 IMPLANT
CATH KIT ON-Q SILVERSOAK 5IN (CATHETERS) ×2 IMPLANT
CEMENT HV SMART SET (Cement) ×4 IMPLANT
CLOTH BEACON ORANGE TIMEOUT ST (SAFETY) ×2 IMPLANT
CUFF TOURN SGL QUICK 34 (TOURNIQUET CUFF) ×2
CUFF TRNQT CYL 34X4X40X1 (TOURNIQUET CUFF) ×1 IMPLANT
DRAPE EXTREMITY T 121X128X90 (DRAPE) ×2 IMPLANT
DRAPE POUCH INSTRU U-SHP 10X18 (DRAPES) ×2 IMPLANT
DRAPE U-SHAPE 47X51 STRL (DRAPES) ×2 IMPLANT
DRSG ADAPTIC 3X8 NADH LF (GAUZE/BANDAGES/DRESSINGS) ×2 IMPLANT
DRSG PAD ABDOMINAL 8X10 ST (GAUZE/BANDAGES/DRESSINGS) ×1 IMPLANT
DURAPREP 26ML APPLICATOR (WOUND CARE) ×2 IMPLANT
ELECT REM PT RETURN 9FT ADLT (ELECTROSURGICAL) ×2
ELECTRODE REM PT RTRN 9FT ADLT (ELECTROSURGICAL) ×1 IMPLANT
EVACUATOR 1/8 PVC DRAIN (DRAIN) ×2 IMPLANT
FACESHIELD LNG OPTICON STERILE (SAFETY) ×10 IMPLANT
GLOVE BIO SURGEON STRL SZ7.5 (GLOVE) ×2 IMPLANT
GLOVE BIO SURGEON STRL SZ8 (GLOVE) ×2 IMPLANT
GLOVE BIOGEL PI IND STRL 8 (GLOVE) ×2 IMPLANT
GLOVE BIOGEL PI INDICATOR 8 (GLOVE) ×2
GOWN STRL NON-REIN LRG LVL3 (GOWN DISPOSABLE) ×2 IMPLANT
GOWN STRL REIN XL XLG (GOWN DISPOSABLE) ×2 IMPLANT
HANDPIECE INTERPULSE COAX TIP (DISPOSABLE) ×2
IMMOBILIZER KNEE 20 (SOFTGOODS) ×2
IMMOBILIZER KNEE 20 THIGH 36 (SOFTGOODS) ×1 IMPLANT
KIT BASIN OR (CUSTOM PROCEDURE TRAY) ×2 IMPLANT
MANIFOLD NEPTUNE II (INSTRUMENTS) ×2 IMPLANT
NS IRRIG 1000ML POUR BTL (IV SOLUTION) ×2 IMPLANT
PACK TOTAL JOINT (CUSTOM PROCEDURE TRAY) ×2 IMPLANT
PAD ABD 7.5X8 STRL (GAUZE/BANDAGES/DRESSINGS) ×1 IMPLANT
PADDING CAST COTTON 6X4 STRL (CAST SUPPLIES) ×6 IMPLANT
PADDING WEBRIL 6 STERILE (GAUZE/BANDAGES/DRESSINGS) ×1 IMPLANT
POSITIONER SURGICAL ARM (MISCELLANEOUS) ×2 IMPLANT
SET HNDPC FAN SPRY TIP SCT (DISPOSABLE) ×1 IMPLANT
SPONGE GAUZE 4X4 12PLY (GAUZE/BANDAGES/DRESSINGS) ×2 IMPLANT
STRIP CLOSURE SKIN 1/2X4 (GAUZE/BANDAGES/DRESSINGS) ×4 IMPLANT
SUCTION FRAZIER 12FR DISP (SUCTIONS) ×2 IMPLANT
SUT MNCRL AB 4-0 PS2 18 (SUTURE) ×2 IMPLANT
SUT PDS AB 1 CT1 27 (SUTURE) ×6 IMPLANT
SUT VIC AB 2-0 CT1 27 (SUTURE) ×6
SUT VIC AB 2-0 CT1 TAPERPNT 27 (SUTURE) ×3 IMPLANT
TOWEL OR 17X26 10 PK STRL BLUE (TOWEL DISPOSABLE) ×4 IMPLANT
TRAY FOLEY CATH 14FRSI W/METER (CATHETERS) ×2 IMPLANT
WATER STERILE IRR 1500ML POUR (IV SOLUTION) ×2 IMPLANT
WRAP KNEE MAXI GEL POST OP (GAUZE/BANDAGES/DRESSINGS) ×2 IMPLANT

## 2011-07-12 NOTE — Anesthesia Preprocedure Evaluation (Signed)
Anesthesia Evaluation  Patient identified by MRN, date of birth, ID band Patient awake    Reviewed: Allergy & Precautions, H&P , NPO status , Patient's Chart, lab work & pertinent test results  Airway Mallampati: II TM Distance: >3 FB Neck ROM: Full    Dental No notable dental hx.    Pulmonary neg pulmonary ROS, sleep apnea and Continuous Positive Airway Pressure Ventilation ,  clear to auscultation  Pulmonary exam normal       Cardiovascular hypertension, neg cardio ROS Regular Normal    Neuro/Psych Negative Neurological ROS  Negative Psych ROS   GI/Hepatic negative GI ROS, Neg liver ROS, GERD-  Medicated and Controlled,  Endo/Other  Negative Endocrine ROS  Renal/GU negative Renal ROS  Genitourinary negative   Musculoskeletal negative musculoskeletal ROS (+)   Abdominal   Peds negative pediatric ROS (+)  Hematology negative hematology ROS (+)   Anesthesia Other Findings   Reproductive/Obstetrics negative OB ROS                           Anesthesia Physical Anesthesia Plan  ASA: II  Anesthesia Plan: Spinal   Post-op Pain Management:    Induction:   Airway Management Planned:   Additional Equipment:   Intra-op Plan:   Post-operative Plan:   Informed Consent: I have reviewed the patients History and Physical, chart, labs and discussed the procedure including the risks, benefits and alternatives for the proposed anesthesia with the patient or authorized representative who has indicated his/her understanding and acceptance.   Dental advisory given  Plan Discussed with: CRNA  Anesthesia Plan Comments:         Anesthesia Quick Evaluation

## 2011-07-12 NOTE — Op Note (Signed)
Pre-operative diagnosis- Osteoarthritis  Left knee(s)  Post-operative diagnosis- Osteoarthritis Left knee(s)  Procedure-  Left  Total Knee Arthroplasty  Surgeon- Gus Rankin. Armon Orvis, MD  Assistant- Avel Peace, PA-C   Anesthesia-  Spinal  EBL-* No blood loss amount entered *   Drains Hemovac  Tourniquet time- 39 minutes @ 300 mm Hg  Complications- None  Condition-PACU - hemodynamically stable.   Brief Clinical Note   Harry Leach is a 65 y.o. year old male with end stage OA of his left knee with progressively worsening pain and dysfunction. He has constant pain, with activity and at rest and significant functional deficits with difficulties even with ADLs. He has had extensive non-op management including analgesics, injections of cortisone, and home exercise program, but remains in significant pain with significant dysfunction. Radiographs show bone on bone arthritis medial and patellofemoral with varus deformity. He presents now for left Total Knee Arthroplasty.     Procedure in detail---   The patient is brought into the operating room and positioned supine on the operating table. After successful administration of  Spinal,   a tourniquet is placed high on the  Left thigh(s) and the lower extremity is prepped and draped in the usual sterile fashion. Time out is performed by the operating team and then the  Left lower extremity is wrapped in Esmarch, knee flexed and the tourniquet inflated to 300 mmHg.       A midline incision is made with a ten blade through the subcutaneous tissue to the level of the extensor mechanism. A fresh blade is used to make a medial parapatellar arthrotomy. Soft tissue over the proximal medial tibia is subperiosteally elevated to the joint line with a knife and into the semimembranosus bursa with a Cobb elevator. Soft tissue over the proximal lateral tibia is elevated with attention being paid to avoiding the patellar tendon on the tibial tubercle. The  patella is everted, knee flexed 90 degrees and the ACL and PCL are removed. Findings are bone on bone medial and patellofemoral with massive medial and patellar osteophytes.        The drill is used to create a starting hole in the distal femur and the canal is thoroughly irrigated with sterile saline to remove the fatty contents. The 5 degree Left  valgus alignment guide is placed into the femoral canal and the distal femoral cutting block is pinned to remove 11 mm off the distal femur. Resection is made with an oscillating saw.      The tibia is subluxed forward and the menisci are removed. The extramedullary alignment guide is placed referencing proximally at the medial aspect of the tibial tubercle and distally along the second metatarsal axis and tibial crest. The block is pinned to remove 2mm off the more deficient medial  side. Resection is made with an oscillating saw. Size 4is the most appropriate size for the tibia and the proximal tibia is prepared with the modular drill and keel punch for that size.      The femoral sizing guide is placed and size 5 is most appropriate. Rotation is marked off the epicondylar axis and confirmed by creating a rectangular flexion gap at 90 degrees. The size 5 cutting block is pinned in this rotation and the anterior, posterior and chamfer cuts are made with the oscillating saw. The intercondylar block is then placed and that cut is made.      Trial size 4 tibial component, trial size 5 posterior stabilized femur and a  12.5  mm posterior stabilized rotating platform insert trial is placed. Full extension is achieved with excellent varus/valgus and anterior/posterior balance throughout full range of motion. The patella is everted and thickness measured to be 27  mm. Free hand resection is taken to 15 mm, a 41 template is placed, lug holes are drilled, trial patella is placed, and it tracks normally. Osteophytes are removed off the posterior femur with the trial in place.  All trials are removed and the cut bone surfaces prepared with pulsatile lavage. Cement is mixed and once ready for implantation, the size 4 tibial implant, size  5 posterior stabilized femoral component, and the size 41 patella are cemented in place and the patella is held with the clamp. The trial insert is placed and the knee held in full extension. All extruded cement is removed and once the cement is hard the permanent 12.5 mm posterior stabilized rotating platform insert is placed into the tibial tray.      The wound is copiously irrigated with saline solution and the extensor mechanism closed over a hemovac drain with #1 PDS suture. The tourniquet is released for a total tourniquet time of 39  minutes. Flexion against gravity is 140 degrees and the patella tracks normally. Subcutaneous tissue is closed with 2.0 vicryl and subcuticular with running 4.0 Monocryl. The catheter for the Marcaine pain pump is placed and the pump is initiated. The incision is cleaned and dried and steri-strips and a bulky sterile dressing are applied. The limb is placed into a knee immobilizer and the patient is awakened and transported to recovery in stable condition.      Please note that a surgical assistant was a medical necessity for this procedure in order to perform it in a safe and expeditious manner. Surgical assistant was necessary to retract the ligaments and vital neurovascular structures to prevent injury to them and also necessary for proper positioning of the limb to allow for anatomic placement of the prosthesis.   Gus Rankin Timika Muench, MD    07/12/2011, 1:38 PM

## 2011-07-12 NOTE — Anesthesia Postprocedure Evaluation (Signed)
  Anesthesia Post-op Note  Patient: Harry Leach  Procedure(s) Performed: Procedure(s) (LRB): TOTAL KNEE ARTHROPLASTY (Left)  Patient Location: PACU  Anesthesia Type: SAB  Level of Consciousness: awake and alert   Airway and Oxygen Therapy: Patient Spontanous Breathing  Post-op Pain: mild  Post-op Assessment: Post-op Vital signs reviewed, Patient's Cardiovascular Status Stable, Respiratory Function Stable, Patent Airway and No signs of Nausea or vomiting  Post-op Vital Signs: stable  Complications: No apparent anesthesia complications

## 2011-07-12 NOTE — Preoperative (Signed)
Beta Blockers   Reason not to administer Beta Blockers:pt took tenoretic this am

## 2011-07-12 NOTE — Progress Notes (Signed)
ANTICOAGULATION CONSULT NOTE - Initial Consult  Pharmacy Consult for Warfarin Indication: VTE prophylaxis s/p TKA  No Known Allergies  Patient Measurements:   Last known weight 91.6 kg  Vital Signs: Temp: 97.4 F (36.3 C) (02/27 1545) Temp src: Oral (02/27 0924) BP: 113/68 mmHg (02/27 1545) Pulse Rate: 48  (02/27 1545)  Labs: No results found for this basename: HGB:2,HCT:3,PLT:3,APTT:3,LABPROT:3,INR:3,HEPARINUNFRC:3,CREATININE:3,CKTOTAL:3,CKMB:3,TROPONINI:3 in the last 72 hours The CrCl is unknown because both a height and weight (above a minimum accepted value) are required for this calculation.  Medical History: Past Medical History  Diagnosis Date  . GERD (gastroesophageal reflux disease)   . Arthritis   . Hypertension     stress test 1999/ clearance Dr Chilton Si on chart   EKG 6/12 on chart  . Sleep apnea     last study  2008- MODERATE  Dr Maple Hudson sleep study EPIC    Medications:  Scheduled:    . acetaminophen  1,000 mg Intravenous Once  . acetaminophen  1,000 mg Intravenous Q6H  . atenolol  100 mg Oral Once  . atenolol-chlorthalidone  1 tablet Oral QAC breakfast  .  ceFAZolin (ANCEF) IV  1 g Intravenous Q6H  .  ceFAZolin (ANCEF) IV  2 g Intravenous Once  . dexamethasone  10 mg Intravenous Once  . docusate sodium  100 mg Oral BID  . finasteride  5 mg Oral Daily  . morphine   Intravenous Q4H  . morphine      . pantoprazole  40 mg Oral Q1200  . Tamsulosin HCl  0.4 mg Oral BID  . DISCONTD:  ceFAZolin (ANCEF) IV  1 g Intravenous 60 min Pre-Op    Assessment:  66 yo M s/p Left Total Knee Arthroplasty  Coumadin ordered for VTE prophylaxis  No complications from procedure noted  Warfarin points = 7  Goal of Therapy:  INR 2-3   Plan:   Warfarin 7.5 mg PO today at 2000  Warfarin education, book and video  Daily INR, CBC   Nevae Pinnix, Mal Amabile 07/12/2011,4:29 PM

## 2011-07-12 NOTE — Interval H&P Note (Signed)
History and Physical Interval Note:  07/12/2011 12:13 PM  Harry Leach  has presented today for surgery, with the diagnosis of osteoarthritis left knee  The various methods of treatment have been discussed with the patient and family. After consideration of risks, benefits and other options for treatment, the patient has consented to  Procedure(s) (LRB): TOTAL KNEE ARTHROPLASTY (Left) as a surgical intervention .  The patients' history has been reviewed, patient examined, no change in status, stable for surgery.  I have reviewed the patients' chart and labs.  Questions were answered to the patient's satisfaction.     Loanne Drilling

## 2011-07-12 NOTE — Transfer of Care (Signed)
Immediate Anesthesia Transfer of Care Note  Patient: Harry Leach  Procedure(s) Performed: Procedure(s) (LRB): TOTAL KNEE ARTHROPLASTY (Left)  Patient Location: PACU  Anesthesia Type: MAC combined with regional for post-op pain  Level of Consciousness: awake, alert  and oriented  Airway & Oxygen Therapy: Patient Spontanous Breathing and Patient connected to face mask oxygen  Post-op Assessment: Report given to PACU RN and Post -op Vital signs reviewed and stable  Post vital signs: Reviewed and stable  Complications: No apparent anesthesia complications

## 2011-07-12 NOTE — Anesthesia Procedure Notes (Signed)
Spinal  Patient location during procedure: OR Staffing Anesthesiologist: Josep Luviano Performed by: anesthesiologist  Preanesthetic Checklist Completed: patient identified, site marked, surgical consent, pre-op evaluation, timeout performed, IV checked, risks and benefits discussed and monitors and equipment checked Spinal Block Patient position: sitting Prep: Betadine Patient monitoring: heart rate, continuous pulse ox and blood pressure Location: L3-4 Injection technique: single-shot Needle Needle type: Spinocan  Needle gauge: 22 G Needle length: 9 cm Additional Notes Expiration date of kit checked and confirmed. Patient tolerated procedure well, without complications.     

## 2011-07-13 DIAGNOSIS — E876 Hypokalemia: Secondary | ICD-10-CM | POA: Diagnosis not present

## 2011-07-13 LAB — BASIC METABOLIC PANEL
BUN: 19 mg/dL (ref 6–23)
Calcium: 8.8 mg/dL (ref 8.4–10.5)
Creatinine, Ser: 1.17 mg/dL (ref 0.50–1.35)
GFR calc Af Amer: 75 mL/min — ABNORMAL LOW (ref >90)
GFR calc non Af Amer: 65 mL/min — ABNORMAL LOW (ref >90)

## 2011-07-13 LAB — CBC
MCH: 29.8 pg (ref 26.0–34.0)
MCHC: 35 g/dL (ref 30.0–36.0)
MCV: 85.2 fL (ref 78.0–100.0)
Platelets: 173 10*3/uL (ref 150–400)
RDW: 13.5 % (ref 11.5–15.5)
WBC: 14.5 10*3/uL — ABNORMAL HIGH (ref 4.0–10.5)

## 2011-07-13 LAB — PROTIME-INR: Prothrombin Time: 14.7 seconds (ref 11.6–15.2)

## 2011-07-13 MED ORDER — WARFARIN SODIUM 7.5 MG PO TABS
7.5000 mg | ORAL_TABLET | Freq: Once | ORAL | Status: AC
  Administered 2011-07-13: 7.5 mg via ORAL
  Filled 2011-07-13: qty 1

## 2011-07-13 MED ORDER — OMEPRAZOLE 20 MG PO CPDR
20.0000 mg | DELAYED_RELEASE_CAPSULE | Freq: Every day | ORAL | Status: DC
  Administered 2011-07-13 – 2011-07-15 (×3): 20 mg via ORAL
  Filled 2011-07-13 (×3): qty 1

## 2011-07-13 MED ORDER — HYDROMORPHONE HCL PF 1 MG/ML IJ SOLN
0.5000 mg | INTRAMUSCULAR | Status: DC | PRN
  Administered 2011-07-13 (×2): 1 mg via INTRAVENOUS
  Filled 2011-07-13 (×2): qty 1

## 2011-07-13 MED ORDER — POTASSIUM CHLORIDE CRYS ER 20 MEQ PO TBCR
40.0000 meq | EXTENDED_RELEASE_TABLET | Freq: Every day | ORAL | Status: AC
  Administered 2011-07-13 – 2011-07-14 (×2): 40 meq via ORAL
  Filled 2011-07-13 (×2): qty 2

## 2011-07-13 MED ORDER — NON FORMULARY: 20.0000 mg | Freq: Every day | Status: DC

## 2011-07-13 NOTE — Progress Notes (Signed)
Subjective: 1 Day Post-Op Procedure(s) (LRB): TOTAL KNEE ARTHROPLASTY (Left) Patient reports pain as mild.   Patient has complaints of soreness and not much sleep last evening.  Pain appears controlled. We will start therapy today. Plan is to go home after hospital stay.  Objective: Vital signs in last 24 hours: Temp:  [97.4 F (36.3 C)-98.1 F (36.7 C)] 97.9 F (36.6 C) (02/28 1013) Pulse Rate:  [45-61] 57  (02/28 1013) Resp:  [11-19] 16  (02/28 1013) BP: (111-136)/(59-78) 120/72 mmHg (02/28 1013) SpO2:  [94 %-100 %] 97 % (02/28 1013) FiO2 (%):  [99 %] 99 % (02/28 0016) Weight:  [91.627 kg (202 lb)] 91.627 kg (202 lb) (02/27 1658)  Intake/Output from previous day:  Intake/Output Summary (Last 24 hours) at 07/13/11 1052 Last data filed at 07/13/11 0845  Gross per 24 hour  Intake 4283.5 ml  Output   1555 ml  Net 2728.5 ml    Intake/Output this shift: Total I/O In: 220 [P.O.:200; Other:20] Out: 200 [Urine:200]  Labs:  [facility] 07/13/11 0427  HGB 11.7*    Basename 07/13/11 0427  WBC 14.5*  RBC 3.92*  HCT 33.4*  PLT 173    Basename 07/13/11 0427  NA 135  K 3.4*  CL 100  CO2 30  BUN 19  CREATININE 1.17  GLUCOSE 190*  CALCIUM 8.8    Basename 07/13/11 0427  LABPT --  INR 1.13    Exam - Neurovascular intact Sensation intact distally Dressing - clean, dry, no drainage Motor function intact - moving foot and toes well on exam.  Hemovac pulled without difficulty.  Past Medical History  Diagnosis Date  . GERD (gastroesophageal reflux disease)   . Arthritis   . Hypertension     stress test 1999/ clearance Dr Chilton Si on chart   EKG 6/12 on chart  . Sleep apnea     last study  2008- MODERATE  Dr Maple Hudson sleep study EPIC    Assessment/Plan: 1 Day Post-Op Procedure(s) (LRB): TOTAL KNEE ARTHROPLASTY (Left) Principal Problem:  *OA (osteoarthritis) of knee   Advance diet Up with therapy Continue foley due to strict I&O and urinary output  monitoring Discharge home with home health  DVT Prophylaxis - Coumadin Protocol Weight-Bearing as tolerated to left leg Keep foley until tomorrow. No vaccines. D/C PCA, Change to IV push D/C O2 and Pulse OX and try on Room [address]  Harry Leach 07/13/2011, 10:52 AM

## 2011-07-13 NOTE — Evaluation (Signed)
Physical Therapy Evaluation Patient Details Name: Harry Leach MRN: [id number] DOB: 06-08-1945 Today's Date: 07/13/2011  Problem List:  Patient Active Problem List  Diagnoses  . SLEEP APNEA, OBSTRUCTIVE  . ALLERGIC RHINITIS  . G E R D  . OA (osteoarthritis) of knee  . Postop Hypokalemia    Past Medical History:  Past Medical History  Diagnosis Date  . GERD (gastroesophageal reflux disease)   . Arthritis   . Hypertension     stress test 1999/ clearance Dr Chilton Si on chart   EKG 6/12 on chart  . Sleep apnea     last study  2008- MODERATE  Dr Maple Hudson sleep study EPIC   Past Surgical History:  Past Surgical History  Procedure Date  . Knee arthroscopy     x 3   with ACL repair--  last 1999  . Tonsillectomy   . Colonoscopy     x 3/   polypectomy x 1  . Fracture surgery     arm, fingers as a teen    PT Assessment/Plan/Recommendation PT Assessment Clinical Impression Statement: Pt presents s/p L TKA POD 1 with decreased strength, ROM and mobility.  Pt tolerated ambulation well during am session.  Pt will benefit from skilled PT in acute venue to address deficits.  PT recommends HHPT at D/C for follow up therapy to return to prior level of functioning.  PT Recommendation/Assessment: Patient will need skilled PT in the acute care venue PT Problem List: Decreased strength;Decreased range of motion;Decreased activity tolerance;Decreased mobility;Decreased knowledge of use of DME;Pain Barriers to Discharge: None PT Therapy Diagnosis : Difficulty walking;Abnormality of gait;Generalized weakness;Acute pain PT Plan PT Frequency: 7X/week PT Treatment/Interventions: DME instruction;Gait training;Stair training;Functional mobility training;Therapeutic activities;Therapeutic exercise;Patient/family education PT Recommendation Recommendations for Other Services: OT consult Follow Up Recommendations: Home health PT Equipment Recommended: None recommended by PT PT Goals  Acute Rehab PT  Goals PT Goal Formulation: With patient/family Time For Goal Achievement: 4 days Pt will go Supine/Side to Sit: with supervision PT Goal: Supine/Side to Sit - Progress: Goal set today Pt will go Sit to Supine/Side: with supervision PT Goal: Sit to Supine/Side - Progress: Goal set today Pt will go Sit to Stand: with modified independence PT Goal: Sit to Stand - Progress: Goal set today Pt will go Stand to Sit: with modified independence PT Goal: Stand to Sit - Progress: Goal set today Pt will Ambulate: >150 feet;with modified independence;with least restrictive assistive device PT Goal: Ambulate - Progress: Goal set today Pt will Go Up / Down Stairs: 3-5 stairs;with least restrictive assistive device;with supervision PT Goal: Up/Down Stairs - Progress: Goal set today Pt will Perform Home Exercise Program: Independently PT Goal: Perform Home Exercise Program - Progress: Goal set today  PT Evaluation Precautions/Restrictions  Precautions Precautions: Knee Required Braces or Orthoses: Yes Knee Immobilizer: Discontinue once straight leg raise with < 10 degree lag Restrictions Weight Bearing Restrictions: Yes LLE Weight Bearing: Weight bearing as tolerated Prior Functioning  Home Living Lives With: Spouse Receives Help From: Family Home Layout: Two level;Able to live on main level with bedroom/bathroom Alternate Level Stairs-Rails: Right Alternate Level Stairs-Number of Steps: 14 Home Access: Stairs to enter Entrance Stairs-Rails: None Entrance Stairs-Number of Steps: 3 Bathroom Shower/Tub:  (tub downstairs, walk in upstairs) Home Adaptive Equipment: Walker - rolling;Bedside commode/3-in-1;Straight cane Prior Function Level of Independence: Independent with basic ADLs;Independent with gait;Independent with transfers Driving: Yes Vocation: Full time employment Cognition Cognition Arousal/Alertness: Awake/alert Overall Cognitive Status: Appears within functional limits for tasks  assessed Orientation Level: Oriented X4 Sensation/Coordination Sensation Light Touch: Appears Intact Additional Comments: Stated that sensation on top of foot was slightly different than RLE.  Coordination Gross Motor Movements are Fluid and Coordinated: Yes Extremity Assessment RLE Assessment RLE Assessment: Within Functional Limits LLE Assessment LLE Assessment: Exceptions to [facility] LLE Strength LLE Overall Strength Comments: Ankle DF/PF 5/5, able to initiate SLR (3-/5) Mobility (including Balance) Bed Mobility Bed Mobility: Yes Supine to Sit: 4: Min assist Supine to Sit Details (indicate cue type and reason): Requires assist for LLE off of bed with cues for hand placement to self assist.  Transfers Transfers: Yes Sit to Stand: 1: +2 Total assist;Patient percentage (comment);From bed;From elevated surface;With upper extremity assist Sit to Stand Details (indicate cue type and reason): Pt assist 70%.  Requires cues for hand placement on bed before standing.   Stand to Sit: 1: +2 Total assist;Patient percentage (comment);With upper extremity assist;With armrests;To chair/3-in-1 Stand to Sit Details: Pt assist 70%.  Cues for hand placement and LLE management.  Ambulation/Gait Ambulation/Gait: Yes Ambulation/Gait Assistance: 1: +2 Total assist;Patient percentage (comment) Ambulation/Gait Assistance Details (indicate cue type and reason): Pt assist 80%.  Requires cues for sequencing/technique with RW and upright posture.  Ambulation Distance (Feet): 80 Feet Assistive device: Rolling walker Gait Pattern: Step-to pattern Gait velocity: decreased    Exercise    End of Session PT - End of Session Equipment Utilized During Treatment: Gait belt;Left knee immobilizer Activity Tolerance: Patient tolerated treatment well Patient left: in chair;with call bell in reach;with family/visitor present Nurse Communication: Mobility status for transfers;Mobility status for  ambulation General Behavior During Session: [facility] for tasks performed Cognition: [facility] for tasks performed  Page, Meribeth Mattes 07/13/2011, 11:46 AM

## 2011-07-13 NOTE — Progress Notes (Signed)
ANTICOAGULATION CONSULT NOTE - Follow-Up Consult  Pharmacy Consult for Warfarin Indication: VTE prophylaxis s/p TKA  No Known Allergies  Patient Measurements: Height: 5\' 7"  (170.2 cm) Weight: 202 lb (91.627 kg) IBW/kg (Calculated) : 66.1  Last known weight 91.6 kg  Vital Signs: Temp: 97.9 F (36.6 C) (02/28 0704) Temp src: Oral (02/28 0704) BP: 128/73 mmHg (02/28 0704) Pulse Rate: 59  (02/28 0704)  Labs:  Basename 07/13/11 0427  HGB 11.7*  HCT 33.4*  PLT 173  APTT --  LABPROT 14.7  INR 1.13  HEPARINUNFRC --  CREATININE 1.17  CKTOTAL --  CKMB --  TROPONINI --   Estimated Creatinine Clearance: 69.7 ml/min (by C-G formula based on Cr of 1.17).  Medical History: Past Medical History  Diagnosis Date  . GERD (gastroesophageal reflux disease)   . Arthritis   . Hypertension     stress test 1999/ clearance Dr Chilton Si on chart   EKG 6/12 on chart  . Sleep apnea     last study  2008- MODERATE  Dr Maple Hudson sleep study EPIC    Medications:  Scheduled:     . acetaminophen  1,000 mg Intravenous Once  . acetaminophen  1,000 mg Intravenous Q6H  . atenolol  100 mg Oral Once  . tenormin  100 mg Oral QAC breakfast   And  . chlorthalidone  25 mg Oral QAC breakfast  .  ceFAZolin (ANCEF) IV  1 g Intravenous Q6H  .  ceFAZolin (ANCEF) IV  2 g Intravenous Once  . coumadin book   Does not apply Once  . dexamethasone  10 mg Intravenous Once  . docusate sodium  100 mg Oral BID  . finasteride  5 mg Oral Daily  . morphine   Intravenous Q4H  . morphine      . pantoprazole  40 mg Oral Q1200  . Tamsulosin HCl  0.4 mg Oral BID  . warfarin  7.5 mg Oral Once  . warfarin   Does not apply Once  . Warfarin - Pharmacist Dosing Inpatient   Does not apply q1800  . DISCONTD: atenolol-chlorthalidone  1 tablet Oral QAC breakfast  . DISCONTD:  ceFAZolin (ANCEF) IV  1 g Intravenous 60 min Pre-Op   Inpatient warfarin doses: 7.5mg  2/27   Assessment:  66 yo M s/p Left Total Knee  Arthroplasty  Coumadin initiation in progress for VTE prophylaxis  Goal of Therapy:  INR 2-3   Plan:   Repeat warfarin 7.5 mg PO today x 1.  Follow PT/INR daily   Elie Goody, PharmD, BCPS Pager: [phone] 07/13/2011  7:20 AM

## 2011-07-13 NOTE — Progress Notes (Signed)
Physical Therapy Treatment Patient Details Name: Harry Leach MRN: [id number] DOB: Jan 21, 1946 Today's Date: 07/13/2011  PT Assessment/Plan  PT - Assessment/Plan Comments on Treatment Session: Pt progressing well with ambulation and exercises.  Pt with some c/o soreness in back of knee.  RN aware.   PT Plan: Discharge plan remains appropriate PT Frequency: 7X/week Recommendations for Other Services: OT consult Follow Up Recommendations: Home health PT Equipment Recommended: None recommended by PT PT Goals  Acute Rehab PT Goals PT Goal Formulation: With patient/family Time For Goal Achievement: 4 days Pt will go Supine/Side to Sit: with supervision PT Goal: Supine/Side to Sit - Progress: Goal set today Pt will go Sit to Supine/Side: with supervision PT Goal: Sit to Supine/Side - Progress: Progressing toward goal Pt will go Sit to Stand: with modified independence PT Goal: Sit to Stand - Progress: Progressing toward goal Pt will go Stand to Sit: with modified independence PT Goal: Stand to Sit - Progress: Progressing toward goal Pt will Ambulate: >150 feet;with modified independence;with least restrictive assistive device PT Goal: Ambulate - Progress: Progressing toward goal Pt will Go Up / Down Stairs: 3-5 stairs;with least restrictive assistive device;with supervision PT Goal: Up/Down Stairs - Progress: Goal set today Pt will Perform Home Exercise Program: Independently PT Goal: Perform Home Exercise Program - Progress: Progressing toward goal  PT Treatment Precautions/Restrictions  Precautions Precautions: Knee Required Braces or Orthoses: Yes Knee Immobilizer: Discontinue once straight leg raise with < 10 degree lag Restrictions Weight Bearing Restrictions: Yes LLE Weight Bearing: Weight bearing as tolerated Mobility (including Balance) Bed Mobility Bed Mobility: Yes Supine to Sit: 4: Min assist Supine to Sit Details (indicate cue type and reason): Requires assist  for LLE off of bed with cues for hand placement to self assist.  Sit to Supine: 4: Min assist Sit to Supine - Details (indicate cue type and reason): Requires min assist for LLE into bed.  Pt demos good technique with hand placement.  Transfers Transfers: Yes Sit to Stand: 4: Min assist;With upper extremity assist;With armrests;From chair/3-in-1 Sit to Stand Details (indicate cue type and reason): Min/guard for safety with cues for hand placement on arm rests when standing.  Stand to Sit: 4: Min assist;With upper extremity assist;To elevated surface;To bed Stand to Sit Details: Min/guard for safety with cues for hand placement and LLE management Ambulation/Gait Ambulation/Gait: Yes Ambulation/Gait Assistance: 4: Min assist Ambulation/Gait Assistance Details (indicate cue type and reason): Min/guard for safety with cues for sequencing/technique.   Ambulation Distance (Feet): 80 Feet Assistive device: Rolling walker Gait Pattern: Step-to pattern Gait velocity: decreased    Exercise  Total Joint Exercises Ankle Circles/Pumps: AROM;Both;20 reps;Seated Quad Sets: AROM;Both;10 reps;Seated Short Arc Quad: AAROM;Left;10 reps;Seated Heel Slides: AAROM;Left;10 reps;Seated Hip ABduction/ADduction: AAROM;Left;10 reps;Seated Straight Leg Raises: AAROM;Left;10 reps;Seated End of Session PT - End of Session Equipment Utilized During Treatment: Left knee immobilizer Activity Tolerance: Patient tolerated treatment well Patient left: in bed;with call bell in reach;with family/visitor present Nurse Communication: Other (comment) (RN aware of pts request for pain meds) General Behavior During Session: [facility] for tasks performed Cognition: [facility] for tasks performed  Page, Meribeth Mattes 07/13/2011, 2:20 PM

## 2011-07-14 ENCOUNTER — Encounter (HOSPITAL_COMMUNITY): Payer: Self-pay | Admitting: Orthopedic Surgery

## 2011-07-14 LAB — CBC
MCH: 30.4 pg (ref 26.0–34.0)
MCHC: 35 g/dL (ref 30.0–36.0)
MCV: 86.6 fL (ref 78.0–100.0)
Platelets: 134 10*3/uL — ABNORMAL LOW (ref 150–400)

## 2011-07-14 LAB — PROTIME-INR
INR: 1.92 — ABNORMAL HIGH (ref 0.00–1.49)
Prothrombin Time: 22.3 seconds — ABNORMAL HIGH (ref 11.6–15.2)

## 2011-07-14 LAB — BASIC METABOLIC PANEL
Calcium: 8.6 mg/dL (ref 8.4–10.5)
Creatinine, Ser: 1.31 mg/dL (ref 0.50–1.35)
GFR calc non Af Amer: 56 mL/min — ABNORMAL LOW (ref >90)
Glucose, Bld: 160 mg/dL — ABNORMAL HIGH (ref 70–99)
Sodium: 136 mEq/L (ref 135–145)

## 2011-07-14 MED ORDER — WARFARIN SODIUM 2.5 MG PO TABS
2.5000 mg | ORAL_TABLET | Freq: Every day | ORAL | Status: DC
Start: 2011-07-14 — End: 2011-07-14
  Filled 2011-07-14: qty 1

## 2011-07-14 MED ORDER — WARFARIN VIDEO: Freq: Once | Status: DC

## 2011-07-14 MED ORDER — WARFARIN SODIUM 2.5 MG PO TABS: 2.5000 mg | ORAL_TABLET | Freq: Every day | ORAL | Status: DC

## 2011-07-14 MED ORDER — WARFARIN SODIUM 1 MG PO TABS
1.0000 mg | ORAL_TABLET | Freq: Once | ORAL | Status: DC
  Filled 2011-07-14: qty 1

## 2011-07-14 MED ORDER — WARFARIN SODIUM 2.5 MG PO TABS
2.5000 mg | ORAL_TABLET | Freq: Every day | ORAL | Status: DC
  Administered 2011-07-14: 2.5 mg via ORAL
  Filled 2011-07-14 (×2): qty 1

## 2011-07-14 MED ORDER — POLYETHYLENE GLYCOL 3350 17 G PO PACK
17.0000 g | PACK | Freq: Every day | ORAL | Status: DC | PRN
  Administered 2011-07-14: 17 g via ORAL
  Filled 2011-07-14: qty 1

## 2011-07-14 MED ORDER — OXYCODONE HCL 5 MG PO TABS: 5.0000 mg | ORAL_TABLET | ORAL | Status: AC | PRN

## 2011-07-14 MED ORDER — BISACODYL 10 MG RE SUPP: 10.0000 mg | Freq: Every day | RECTAL | Status: DC | PRN

## 2011-07-14 MED ORDER — METHOCARBAMOL 500 MG PO TABS: 500.0000 mg | ORAL_TABLET | Freq: Four times a day (QID) | ORAL | Status: AC | PRN

## 2011-07-14 NOTE — Discharge Instructions (Signed)
Knee Rehabilitation, Guidelines Following Surgery Results after knee surgery are often greatly improved when you follow the exercise, range of motion and muscle strengthening exercises prescribed by your doctor. Safety measures are also important to protect the knee from further injury. Any time any of these exercises cause you to have increased pain or swelling in your knee joint, decrease the amount until you are comfortable again and slowly increase them. If you have problems or questions, call your caregiver or physical therapist for advice. HOME CARE INSTRUCTIONS   Remove items at home which could result in a fall. This includes throw rugs or furniture in walking pathways.   Continue medications as instructed.   You may shower or take tub baths when your staples or stitches are removed or as instructed.   Walk using crutches or walker as instructed.   Put weight on your legs and walk as much as is comfortable.   You may resume a sexual relationship in one month or when given the OK by your doctor.   Return to work as instructed by your doctor.   Do not drive a car for 6 weeks or as instructed.   Wear elastic stockings until instructed not to.   Make sure you keep all of your appointments after your operation with all of your doctors and caregivers.  RANGE OF MOTION AND STRENGTHENING EXERCISES Rehabilitation of the knee is important following a knee injury or an operation. After just a few days of immobilization, the muscles of the thigh which control the knee become weakened and shrink (atrophy). Knee exercises are designed to build up the tone and strength of the thigh muscles and to improve knee motion. Often times heat used for twenty to thirty minutes before working out will loosen up your tissues and help with improving the range of motion. These exercises can be done on a training (exercise) mat, on the floor, on a table or on a bed. Use what ever works the best and is most  comfortable for you Knee exercises include:  Leg Lifts - While your knee is still immobilized in a splint or cast, you can do straight leg raises. Lift the leg to 60 degrees, hold for 3 sec, and slowly lower the leg. Repeat 10-20 times 2-3 times daily. Perform this exercise against resistance later as your knee gets better.   Quad and Hamstring Sets - Tighten up the muscle on the front of the thigh (Quad) and hold for 5-10 sec. Repeat this 10-20 times hourly. Hamstring sets are done by pushing the foot backward against an object and holding for 5-10 sec. Repeat as with quad sets.   A rehabilitation program following serious knee injuries can speed recovery and prevent re-injury in the future due to weakened muscles. Contact your doctor or a physical therapist for more information on knee rehabilitation. MAKE SURE YOU:   Understand these instructions.   Will watch your condition.   Will get help right away if you are not doing well or get worse.  Document Released: 05/01/2005 Document Revised: 01/11/2011 Document Reviewed: 10/19/2006 [employer] Patient Information 2012 [employer].  Pick up stool softner and laxative for home. Do not submerge incision under water. May shower. Continue to use ice for pain and swelling from surgery.  

## 2011-07-14 NOTE — Progress Notes (Addendum)
ANTICOAGULATION CONSULT NOTE - Follow-Up Consult  Pharmacy Consult for Warfarin Indication: VTE prophylaxis s/p TKA  No Known Allergies  Patient Measurements: Height: 5\' 7"  (170.2 cm) Weight: 202 lb (91.627 kg) IBW/kg (Calculated) : 66.1  Last known weight 91.6 kg  Vital Signs: Temp: 99.3 F (37.4 C) (03/01 0530) Temp src: Oral (03/01 0530) BP: 120/74 mmHg (03/01 0530) Pulse Rate: 68  (03/01 0530)  Labs:  Basename 07/14/11 0400 07/13/11 0427  HGB 10.9* 11.7*  HCT 31.1* 33.4*  PLT 134* 173  APTT -- --  LABPROT 22.3* 14.7  INR 1.92* 1.13  HEPARINUNFRC -- --  CREATININE 1.31 1.17  CKTOTAL -- --  CKMB -- --  TROPONINI -- --   Estimated Creatinine Clearance: 62.3 ml/min (by C-G formula based on Cr of 1.31).  Medical History: Past Medical History  Diagnosis Date  . GERD (gastroesophageal reflux disease)   . Arthritis   . Hypertension     stress test 1999/ clearance Dr Chilton Si on chart   EKG 6/12 on chart  . Sleep apnea     last study  2008- MODERATE  Dr Maple Hudson sleep study EPIC    Medications:  Scheduled:     . acetaminophen  1,000 mg Intravenous Q6H  . tenormin  100 mg Oral QAC breakfast   And  . chlorthalidone  25 mg Oral QAC breakfast  . coumadin book   Does not apply Once  . docusate sodium  100 mg Oral BID  . finasteride  5 mg Oral Daily  . omeprazole  20 mg Oral Q1200  . potassium chloride  40 mEq Oral Daily  . Tamsulosin HCl  0.4 mg Oral BID  . warfarin  7.5 mg Oral ONCE-1800  . warfarin   Does not apply Once  . Warfarin - Pharmacist Dosing Inpatient   Does not apply q1800   Inpatient warfarin doses: 7.5mg  2/27 and 2/28   Assessment:  66 yo M s/p Left Total Knee Arthroplasty  INR significantly responding s/p two doses, almost at goal  No bleeding reported  Education completed regarding warfarin indication, adverse effects, importance of INR monitoring, dietary considerations, drug-drug interactions.  He demonstrated appropriate  comprehension.  Goal of Therapy:  INR 2-3   Plan:   Decrease warfarin 2.5 mg PO today x 1.  Follow PT/INR daily  Loralee Pacas, PharmD, BCPS Pager: [phone] 07/14/2011  9:03 AM

## 2011-07-14 NOTE — Evaluation (Signed)
Occupational Therapy Evaluation Patient Details Name: Harry Leach MRN: [id number] DOB: February 03, 1946 Today's Date: 07/14/2011  Problem List:  Patient Active Problem List  Diagnoses  . SLEEP APNEA, OBSTRUCTIVE  . ALLERGIC RHINITIS  . G E R D  . OA (osteoarthritis) of knee  . Postop Hypokalemia    Past Medical History:  Past Medical History  Diagnosis Date  . GERD (gastroesophageal reflux disease)   . Arthritis   . Hypertension     stress test 1999/ clearance Dr Chilton Si on chart   EKG 6/12 on chart  . Sleep apnea     last study  2008- MODERATE  Dr Maple Hudson sleep study EPIC   Past Surgical History:  Past Surgical History  Procedure Date  . Knee arthroscopy     x 3   with ACL repair--  last 1999  . Tonsillectomy   . Colonoscopy     x 3/   polypectomy x 1  . Fracture surgery     arm, fingers as a teen    OT Assessment/Plan/Recommendation OT Assessment Clinical Impression Statement: Pt will benefit from skilled OT services to increase his independence with ADL prior to return home.  OT Recommendation/Assessment: Patient will need skilled OT in the acute care venue OT Problem List: Decreased strength;Decreased knowledge of use of DME or AE;Pain;Decreased range of motion OT Therapy Diagnosis : Generalized weakness;Acute pain OT Plan OT Frequency: Min 2X/week OT Treatment/Interventions: Self-care/ADL training;Therapeutic activities;DME and/or AE instruction;Patient/family education OT Recommendation Follow Up Recommendations: No OT follow up Equipment Recommended: 3 in 1 bedside comode Individuals Consulted Consulted and Agree with Results and Recommendations: Patient OT Goals Acute Rehab OT Goals OT Goal Formulation: With patient Time For Goal Achievement: 7 days ADL Goals Pt Will Perform Grooming: with supervision;Standing at sink ADL Goal: Grooming - Progress: Goal set today Pt Will Perform Lower Body Bathing: with supervision;Sit to stand from chair;Sit to stand  from bed ADL Goal: Lower Body Bathing - Progress: Goal set today Pt Will Perform Lower Body Dressing: with supervision;Sit to stand from chair;Sit to stand from bed ADL Goal: Lower Body Dressing - Progress: Goal set today Pt Will Transfer to Toilet: with supervision;Ambulation;with DME;3-in-1 ADL Goal: Toilet Transfer - Progress: Goal set today Pt Will Perform Toileting - Clothing Manipulation: with supervision;Standing ADL Goal: Toileting - Clothing Manipulation - Progress: Goal set today Pt Will Perform Toileting - Hygiene: with supervision;Sit to stand from 3-in-1/toilet ADL Goal: Toileting - Hygiene - Progress: Goal set today Pt Will Perform Tub/Shower Transfer: Shower transfer;with DME;with supervision;Other (comment) (if patient able to progress to go up stairs to access shower) ADL Goal: Tub/Shower Transfer - Progress: Goal set today  OT Evaluation Precautions/Restrictions  Precautions Precautions: Knee Required Braces or Orthoses: Yes Knee Immobilizer: Discontinue once straight leg raise with < 10 degree lag Restrictions Weight Bearing Restrictions: Yes LLE Weight Bearing: Weight bearing as tolerated Prior Functioning Home Living Lives With: Spouse Receives Help From: Family Home Layout: Two level;Able to live on main level with bedroom/bathroom Alternate Level Stairs-Rails: Right Alternate Level Stairs-Number of Steps: 14 Home Access: Stairs to enter Entrance Stairs-Rails: None Entrance Stairs-Number of Steps: 3 Bathroom Shower/Tub: Tub/shower unit;Walk-in shower;Other (comment) (tub downstairs, walk in upstairs) Bathroom Toilet: Standard Home Adaptive Equipment: Walker - rolling;Bedside commode/3-in-1;Straight cane Prior Function Level of Independence: Independent with basic ADLs;Independent with gait;Independent with transfers Driving: Yes Vocation: Full time employment ADL ADL Eating/Feeding: Simulated;Independent Where Assessed - Eating/Feeding: Chair Grooming:  Simulated;Set up Where Assessed - Grooming: Sitting, chair Upper  Body Bathing: Simulated;Chest;Right arm;Left arm;Abdomen;Set up Where Assessed - Upper Body Bathing: Sitting, bed;Unsupported Lower Body Bathing: Simulated;Minimal assistance Lower Body Bathing Details (indicate cue type and reason): min assist for left foot thoroughness Where Assessed - Lower Body Bathing: Sit to stand from bed Upper Body Dressing: Simulated;Set up Where Assessed - Upper Body Dressing: Sitting, bed;Unsupported Lower Body Dressing: Simulated;Moderate assistance Where Assessed - Lower Body Dressing: Sit to stand from bed Toilet Transfer: Performed;Minimal assistance Toilet Transfer Details (indicate cue type and reason): min verbal cues hand placement and positioning at 3in1 Toilet Transfer Method: Ambulating Toilet Transfer Equipment: Raised toilet seat with arms (or 3-in-1 over toilet) Toileting - Clothing Manipulation: Simulated;Minimal assistance Toileting - Clothing Manipulation Details (indicate cue type and reason): min guard assist Where Assessed - Glass blower/designer Manipulation: Standing Toileting - Hygiene: Simulated;Minimal assistance Toileting - Hygiene Details (indicate cue type and reason): min guard assist Where Assessed - Toileting Hygiene: Standing Tub/Shower Transfer: Not assessed Tub/Shower Transfer Method: Not assessed Equipment Used: Rolling walker ADL Comments: Pt doing well. Educated patient on how to correctly adjust 3in1 for appropriate height to increase his comfort with L LE positioning.  Vision/Perception    Cognition Cognition Arousal/Alertness: Awake/alert Overall Cognitive Status: Appears within functional limits for tasks assessed Orientation Level: Oriented X4 Sensation/Coordination Sensation Light Touch: Appears Intact Extremity Assessment RUE Assessment RUE Assessment: Within Functional Limits LUE Assessment LUE Assessment: Within Functional Limits Mobility    Bed Mobility Bed Mobility: Yes Supine to Sit: 4: Min assist;HOB elevated (Comment degrees) Supine to Sit Details (indicate cue type and reason): assist to support L LE off the bed.  Transfers Sit to Stand: 4: Min assist;From elevated surface;From bed;With upper extremity assist Sit to Stand Details (indicate cue type and reason): min guard assist min verbal cues Stand to Sit: 4: Min assist;With upper extremity assist;To chair/3-in-1 Stand to Sit Details: min guard assist with min verbal cues hand placement Exercises   End of Session OT - End of Session Activity Tolerance: Patient tolerated treatment well Patient left: in chair;with call bell in reach General Behavior During Session: [facility] for tasks performed Cognition: [facility] for tasks performed   Lennox Laity [phone] 07/14/2011, 9:46 AM

## 2011-07-14 NOTE — Progress Notes (Signed)
CARE MANAGEMENT NOTE 07/14/2011  Patient:  Harry Leach, Harry Leach   Account Number:  [id number]  Date Initiated:  07/14/2011  Documentation initiated by:  Colleen Can  Subjective/Objective Assessment:   DX OSTEOARTHRITIS LEFT KNEE; total knee replacemnt on day of admission     Action/Plan:   CM spoke with patient. Plans are for patient to return to his home in [place] where his spouse will be caregiver. Already has RW and 3N1 has been delivered to pt's room. Pt offered choice and wants [facility] for [facility] services.   Anticipated DC Date:  07/15/2011   Anticipated DC Plan:  HOME W HOME HEALTH SERVICES  In-house referral  NA      DC Planning Services  CM consult      [facility] Choice  HOME HEALTH   Choice offered to / List presented to:  C-1 Patient   DME arranged  NA      DME agency  NA     HH arranged  HH-1 RN  HH-2 PT      [facility] agency  [facility]   Status of service:  Completed, signed off Medicare Important Message given?  NO (If response is "NO", the following Medicare IM given date fields will be blank) Date Medicare IM given:   Date Additional Medicare IM given:    Discharge Disposition:    Per UR Regulation:    Comments:  07/14/2011 [facility] will provide HHrn and HHpt, coumadin managemnt. [facility] arranged for DME. Anticipate d/c Saturday 03/02. HH services to start 07/16/2011. list of HH agencies placed in shaqdow chart.

## 2011-07-14 NOTE — Progress Notes (Signed)
Physical Therapy Treatment Patient Details Name: Harry Leach MRN: [id number] DOB: June 13, 1945 Today's Date: 07/14/2011  PT Assessment/Plan  PT - Assessment/Plan Comments on Treatment Session: pt progressing well, frustrated about his hiccups. PT Plan: Discharge plan remains appropriate PT Frequency: 7X/week Follow Up Recommendations: Home health PT Equipment Recommended: 3 in 1 bedside comode PT Goals  Acute Rehab PT Goals Pt will go Supine/Side to Sit: with supervision PT Goal: Supine/Side to Sit - Progress: Progressing toward goal Pt will go Sit to Supine/Side: with supervision PT Goal: Sit to Supine/Side - Progress: Progressing toward goal Pt will go Sit to Stand: with modified independence PT Goal: Sit to Stand - Progress: Progressing toward goal Pt will go Stand to Sit: with modified independence PT Goal: Stand to Sit - Progress: Progressing toward goal Pt will Ambulate: >150 feet;with modified independence;with least restrictive assistive device PT Goal: Ambulate - Progress: Progressing toward goal Pt will Go Up / Down Stairs: 3-5 stairs;with least restrictive assistive device;with supervision PT Goal: Up/Down Stairs - Progress: Progressing toward goal  PT Treatment Precautions/Restrictions  Precautions Precautions: Knee Required Braces or Orthoses: Yes Knee Immobilizer: Discontinue once straight leg raise with < 10 degree lag Restrictions Weight Bearing Restrictions: Yes LLE Weight Bearing: Weight bearing as tolerated Mobility (including Balance) Bed Mobility Supine to Sit: 4: Min assist;HOB flat Supine to Sit Details (indicate cue type and reason): assist to support L LE off the bed Sit to Supine: 4: Min assist Sit to Supine - Details (indicate cue type and reason): Requires min assist for LLE into bed. Pt demos good technique with hand placement Transfers Transfers: Yes Sit to Stand: 5: Supervision;From bed;With upper extremity assist Sit to Stand Details  (indicate cue type and reason): vc for hand placement Stand to Sit: 5: Supervision;To bed;With upper extremity assist Stand to Sit Details: vc to step LLE forward. Ambulation/Gait Ambulation/Gait: Yes Ambulation/Gait Assistance: 5: Supervision Ambulation/Gait Assistance Details (indicate cue type and reason): vc for rolling RW  Ambulation Distance (Feet): 200 Feet Assistive device: Rolling walker Gait Pattern: Step-to pattern Gait velocity: decreased Stairs: Yes Stairs Assistance: 4: Min assist Stairs Assistance Details (indicate cue type and reason): pt practiced 3 steps w/ 1 crutch/1 rail then 2 steps w/ 2 crutches., vc for sequence. Stair Management Technique: No rails;One rail Left;Forwards;With crutches Number of Stairs: 2  (then 3) Height of Stairs: 6  (4)    Exercise  Total Joint Exercises Ankle Circles/Pumps: AROM;Both;20 reps;Seated Quad Sets: AROM;Both;10 reps;Seated Heel Slides: AAROM;Left;10 reps;Seated Hip ABduction/ADduction: AAROM;Left;10 reps;Seated Straight Leg Raises: AAROM;Left;10 reps;Seated End of Session PT - End of Session Equipment Utilized During Treatment: Left knee immobilizer Activity Tolerance: Patient tolerated treatment well Patient left: in bed;with call bell in reach General Behavior During Session: [facility] for tasks performed Cognition: [facility] for tasks performed  Rada Hay 07/14/2011, 3:48 PM

## 2011-07-14 NOTE — Progress Notes (Signed)
Subjective: 2 Days Post-Op Procedure(s) (LRB): TOTAL KNEE ARTHROPLASTY (Left) Patient reports pain as mild and moderate.   Patient has complaints of moderate pain yesterday but better today.  More comfortable. Pain meds are helping.  Probably home tomorrow.  Objective: Vital signs in last 24 hours: Temp:  [98.4 F (36.9 C)-99.3 F (37.4 C)] 99.3 F (37.4 C) (03/01 0530) Pulse Rate:  [58-68] 68  (03/01 0530) Resp:  [16-18] 16  (03/01 0530) BP: (102-129)/(45-77) 120/74 mmHg (03/01 0530) SpO2:  [96 %-99 %] 96 % (03/01 0530)  Intake/Output from previous day:  Intake/Output Summary (Last 24 hours) at 07/14/11 1045 Last data filed at 07/14/11 0530  Gross per 24 hour  Intake    360 ml  Output   1680 ml  Net  -1320 ml    Intake/Output this shift:    Labs:  Basename 07/14/11 0400 07/13/11 0427  HGB 10.9* 11.7*    Basename 07/14/11 0400 07/13/11 0427  WBC 11.3* 14.5*  RBC 3.59* 3.92*  HCT 31.1* 33.4*  PLT 134* 173    Basename 07/14/11 0400 07/13/11 0427  NA 136 135  K 3.4* 3.4*  CL 99 100  CO2 32 30  BUN 17 19  CREATININE 1.31 1.17  GLUCOSE 160* 190*  CALCIUM 8.6 8.8    Basename 07/14/11 0400 07/13/11 0427  LABPT -- --  INR 1.92* 1.13    Exam - Neurovascular intact Sensation intact distally Dressing/Incision - clean, dry, no drainage Motor function intact - moving foot and toes well on exam.   Past Medical History  Diagnosis Date  . GERD (gastroesophageal reflux disease)   . Arthritis   . Hypertension     stress test 1999/ clearance Dr Chilton Si on chart   EKG 6/12 on chart  . Sleep apnea     last study  2008- MODERATE  Dr Maple Hudson sleep study EPIC    Assessment/Plan: 2 Days Post-Op Procedure(s) (LRB): TOTAL KNEE ARTHROPLASTY (Left) Principal Problem:  *OA (osteoarthritis) of knee Active Problems:  Postop Hypokalemia   Advance diet Up with therapy D/C IV fluids Plan for discharge tomorrow Discharge home with home health  DVT Prophylaxis -  Coumadin Protocol - INR 1.92 today. Should be good from that standpoint for home.  3 weeks of coumadin for DVT Prophylaxis. Weight-Bearing as tolerated to left leg  Arlando Leisinger 07/14/2011, 10:45 AM

## 2011-07-14 NOTE — Progress Notes (Signed)
Physical Therapy Treatment Patient Details Name: CHANCELLOR VANDERLOOP MRN: [id number] DOB: 1945/10/20 Today's Date: 07/14/2011  PT Assessment/Plan  PT - Assessment/Plan Comments on Treatment Session: pt progressing well, frustrated about his hiccups. PT Plan: Discharge plan remains appropriate PT Frequency: 7X/week Follow Up Recommendations: Home health PT Equipment Recommended: 3 in 1 bedside comode PT Goals  Acute Rehab PT Goals Pt will go Sit to Stand: with modified independence PT Goal: Sit to Stand - Progress: Progressing toward goal Pt will go Stand to Sit: with modified independence PT Goal: Stand to Sit - Progress: Progressing toward goal Pt will Ambulate: >150 feet;with modified independence;with least restrictive assistive device PT Goal: Ambulate - Progress: Progressing toward goal  PT Treatment Precautions/Restrictions  Precautions Precautions: Knee Required Braces or Orthoses: Yes Knee Immobilizer: Discontinue once straight leg raise with < 10 degree lag Restrictions Weight Bearing Restrictions: Yes LLE Weight Bearing: Weight bearing as tolerated Mobility (including Balance) Bed Mobility Bed Mobility: Yes Supine to Sit: 4: Min assist;HOB elevated (Comment degrees) Supine to Sit Details (indicate cue type and reason): assist to support L LE off the bed.  Transfers Sit to Stand: 4: Min assist;5: Supervision;With armrests;From chair/3-in-1 Sit to Stand Details (indicate cue type and reason): Min/guard for safety with cues for hand placement on arm rests when standing.  Stand to Sit: 4: Min assist;5: Supervision;With armrests;To chair/3-in-1 Stand to Sit Details: Min/guard for safety with cues for hand placement and LLE management Ambulation/Gait Ambulation/Gait Assistance: 4: Min assist;5: Supervision Ambulation/Gait Assistance Details (indicate cue type and reason): Min/guard for safety with cues for sequencing/technique.  Ambulation Distance (Feet): 120  Feet Assistive device: Rolling walker Gait Pattern: Step-to pattern Gait velocity: decreased    Exercise  Total Joint Exercises Ankle Circles/Pumps: AROM;Both;20 reps;Seated Quad Sets: AROM;Both;10 reps;Seated Heel Slides: AAROM;Left;10 reps;Seated Hip ABduction/ADduction: AAROM;Left;10 reps;Seated Straight Leg Raises: AAROM;Left;10 reps;Seated End of Session PT - End of Session Equipment Utilized During Treatment: Left knee immobilizer Activity Tolerance: Patient tolerated treatment well Patient left: in bed;with call bell in reach;with family/visitor present General Behavior During Session: [facility] for tasks performed Cognition: [facility] for tasks performed  [facility] 07/14/2011, 1:35 PM

## 2011-07-15 LAB — BASIC METABOLIC PANEL
Chloride: 99 mEq/L (ref 96–112)
GFR calc Af Amer: 68 mL/min — ABNORMAL LOW (ref >90)
GFR calc non Af Amer: 59 mL/min — ABNORMAL LOW (ref >90)
Potassium: 3.5 mEq/L (ref 3.5–5.1)
Sodium: 135 mEq/L (ref 135–145)

## 2011-07-15 LAB — CBC
Hemoglobin: 10.8 g/dL — ABNORMAL LOW (ref 13.0–17.0)
MCH: 30.1 pg (ref 26.0–34.0)
MCV: 86.6 fL (ref 78.0–100.0)
Platelets: 136 10*3/uL — ABNORMAL LOW (ref 150–400)
RBC: 3.59 MIL/uL — ABNORMAL LOW (ref 4.22–5.81)
WBC: 8.8 10*3/uL (ref 4.0–10.5)

## 2011-07-15 LAB — PROTIME-INR: Prothrombin Time: 25.3 seconds — ABNORMAL HIGH (ref 11.6–15.2)

## 2011-07-15 MED ORDER — CHLORPROMAZINE HCL 25 MG PO TABS: 25.0000 mg | ORAL_TABLET | Freq: Three times a day (TID) | ORAL | Status: DC

## 2011-07-15 MED ORDER — CHLORPROMAZINE HCL 25 MG PO TABS
25.0000 mg | ORAL_TABLET | Freq: Once | ORAL | Status: AC
  Administered 2011-07-15: 25 mg via ORAL
  Filled 2011-07-15: qty 1

## 2011-07-15 NOTE — Progress Notes (Signed)
Occupational Therapy Treatment Patient Details Name: Harry Leach MRN: [id number] DOB: 06/13/1945 Today's Date: 07/15/2011  OT Assessment/Plan OT Assessment/Plan OT Plan: Discharge plan remains appropriate OT Frequency: Min 2X/week Follow Up Recommendations: No OT follow up Equipment Recommended: 3 in 1 bedside comode OT Goals ADL Goals Pt Will Perform Lower Body Bathing: with supervision;Sit to stand from chair;Sit to stand from bed ADL Goal: Lower Body Bathing - Progress: Progressing toward goals Pt Will Perform Lower Body Dressing: with supervision;Sit to stand from chair;Sit to stand from bed ADL Goal: Lower Body Dressing - Progress: Progressing toward goals Pt Will Transfer to Toilet: with supervision;Ambulation;with DME;3-in-1 ADL Goal: Toilet Transfer - Progress: Met Pt Will Perform Toileting - Clothing Manipulation: with supervision;Standing;Independently ADL Goal: Toileting - Clothing Manipulation - Progress: Met Pt Will Perform Tub/Shower Transfer: Shower transfer;with DME;with supervision;Other (comment) ADL Goal: Tub/Shower Transfer - Progress: Met  OT Treatment Precautions/Restrictions  Precautions Precautions: Knee Knee Immobilizer: Discontinue once straight leg raise with < 10 degree lag Restrictions LLE Weight Bearing: Weight bearing as tolerated   ADL ADL Lower Body Bathing: Performed;Minimal assistance;Other (comment) (pt wanted to wash up but felt "puny":  wife will assist ) Where Assessed - Lower Body Bathing: Sit to stand from chair;Other (comment) (3:1) Lower Body Dressing: Moderate assistance;Performed;Other (comment) (pants and underwear:  no AE) Where Assessed - Lower Body Dressing: Sit to stand from chair;Other (comment) (3:1) Toilet Transfer: Research scientist (life sciences) Method: Proofreader: Raised toilet seat with arms (or 3-in-1 over toilet) Toileting - Clothing Manipulation:  Performed;Supervision/safety Where Assessed - Toileting Clothing Manipulation: Sit to stand from 3-in-1 or toilet Tub/Shower Transfer: Performed;Supervision/safety Tub/Shower Transfer Method: Science writer: Walk in Scientist, research (physical sciences) Used: Rolling walker Ambulation Related to ADLs: min guard ADL Comments: wanted to wash up after bathroom transfers but felt "puny" Mobility  Bed Mobility Supine to Sit: 4: Min assist;HOB flat;With rails Transfers Sit to Stand: 5: Supervision;From bed;From chair/3-in-1;With upper extremity assist Exercises    End of Session OT - End of Session Activity Tolerance: Other (comment) (felt middle of session) Patient left: in chair;with call bell in reach;with family/visitor present General Behavior During Session:  [facility] for tasks performed Cognition: [facility] for tasks performed  [facility]  07/15/2011, 10:30 AM

## 2011-07-15 NOTE — Progress Notes (Signed)
ANTICOAGULATION CONSULT NOTE - Follow-Up Consult  Pharmacy Consult for Warfarin Indication: VTE prophylaxis s/p TKA  No Known Allergies  Patient Measurements: Height: 5\' 7"  (170.2 cm) Weight: 202 lb (91.627 kg) IBW/kg (Calculated) : 66.1   Vital Signs: Temp: 99.9 F (37.7 C) (03/02 0619) BP: 123/77 mmHg (03/02 0619) Pulse Rate: 68  (03/02 0619)  Labs:  Basename 07/15/11 0429 07/14/11 0400 07/13/11 0427  HGB 10.8* 10.9* --  HCT 31.1* 31.1* 33.4*  PLT 136* 134* 173  APTT -- -- --  LABPROT 25.3* 22.3* 14.7  INR 2.26* 1.92* 1.13  HEPARINUNFRC -- -- --  CREATININE 1.26 1.31 1.17  CKTOTAL -- -- --  CKMB -- -- --  TROPONINI -- -- --   Estimated Creatinine Clearance: 64.8 ml/min (by C-G formula based on Cr of 1.26).  Medical History: Past Medical History  Diagnosis Date  . GERD (gastroesophageal reflux disease)   . Arthritis   . Hypertension     stress test 1999/ clearance Dr Chilton Si on chart   EKG 6/12 on chart  . Sleep apnea     last study  2008- MODERATE  Dr Maple Hudson sleep study EPIC    Medications:  Scheduled:     . tenormin  100 mg Oral QAC breakfast   And  . chlorthalidone  25 mg Oral QAC breakfast  . docusate sodium  100 mg Oral BID  . finasteride  5 mg Oral Daily  . omeprazole  20 mg Oral Q1200  . Tamsulosin HCl  0.4 mg Oral BID  . warfarin  2.5 mg Oral q1800  . warfarin   Does not apply Once  . warfarin   Does not apply Once  . Warfarin - Pharmacist Dosing Inpatient   Does not apply q1800   Inpatient warfarin doses: 2/27 7.5mg , 2/28 7.5mg , 3/1 2.5mg    Assessment:  66 yo M s/p Left Total Knee Arthroplasty  INR therapeutic after 3 doses, has significantly responded  No bleeding reported  Education completed regarding warfarin  Goal of Therapy:  INR 2-3   Plan:   Repeat warfarin 2.5 mg PO today x 1.  Daily INR, CBC   Lynann Beaver PharmD  Pager [phone] 07/15/2011 11:38 AM

## 2011-07-15 NOTE — Progress Notes (Signed)
Patient discharged to home. Dc instructions given with wife at bedside. No concerns voiced. One-time order for thorazine carried out. Med given to patient prior to DC. Prescriptions x 4 given to patient at DC. Left unit in wheelchair pushed by nurse tech. Left in good condition. Vwilliams, rn.

## 2011-07-15 NOTE — Progress Notes (Signed)
Subjective: 3 Days Post-Op Procedure(s) (LRB): TOTAL KNEE ARTHROPLASTY (Left)   Patient reports pain as mild. C/O hiccups, but otherwise no events or issues. Ready to be discharged home with HHPT.  Objective:   VITALS:   Filed Vitals:   07/15/11 0619  BP: 123/77  Pulse: 68  Temp: 99.9 F (37.7 C)  Resp: 16    Neurovascular intact Dorsiflexion/Plantar flexion intact Incision: dressing C/D/I No cellulitis present Compartment soft  LABS  Basename 07/15/11 0429 07/14/11 0400 07/13/11 0427  HGB 10.8* 10.9* 11.7*  HCT 31.1* 31.1* 33.4*  WBC 8.8 11.3* 14.5*  PLT 136* 134* 173     Basename 07/15/11 0429 07/14/11 0400 07/13/11 0427  NA 135 136 135  K 3.5 3.4* 3.4*  BUN 16 17 19   CREATININE 1.26 1.31 1.17  GLUCOSE 120* 160* 190*     Assessment/Plan: 3 Days Post-Op Procedure(s) (LRB): TOTAL KNEE ARTHROPLASTY (Left)   Up with therapy Discharge home with home health today Follow up as instructed   Anastasio Auerbach. Lynann Demetrius   PAC  07/15/2011, 8:29 AM

## 2011-07-15 NOTE — Progress Notes (Signed)
Cm spoke with patient concerning d/c planning. Per pt [facility] to provide [facility] services. [name] DME present in room during interview. Wife at bedside to assist in home care. Cm to alert [facility] of pt's discharge.  Harry Leach [phone]

## 2011-07-15 NOTE — Progress Notes (Signed)
Patient with severe hiccups x 4 days.PA notified. N.O given. Vwilliams,rn.

## 2011-07-15 NOTE — Progress Notes (Signed)
Physical Therapy Treatment Patient Details Name: Harry Leach MRN: [id number] DOB: 19-Feb-1946 Today's Date: 07/15/2011  PT Assessment/Plan  PT - Assessment/Plan Comments on Treatment Session: pt progressing well, frustrated about his hiccups. PT Plan: Discharge plan remains appropriate PT Frequency: 7X/week Follow Up Recommendations: Home health PT Equipment Recommended:  (Has DME) PT Goals  Acute Rehab PT Goals Pt will go Sit to Stand: with modified independence PT Goal: Sit to Stand - Progress: Met Pt will go Stand to Sit: with modified independence PT Goal: Stand to Sit - Progress: Met Pt will Ambulate: >150 feet;with modified independence;with least restrictive assistive device PT Goal: Ambulate - Progress: Progressing toward goal Pt will Go Up / Down Stairs: 3-5 stairs;with least restrictive assistive device;with supervision PT Goal: Up/Down Stairs - Progress: Partly met Pt will Perform Home Exercise Program: Independently PT Goal: Perform Home Exercise Program - Progress: Partly met  PT Treatment Precautions/Restrictions  Precautions Precautions: Knee Required Braces or Orthoses: Yes Knee Immobilizer: Discontinue once straight leg raise with < 10 degree lag Restrictions Weight Bearing Restrictions: Yes LLE Weight Bearing: Weight bearing as tolerated Mobility (including Balance) Bed Mobility Supine to Sit: 4: Min assist;HOB flat;With rails Transfers Sit to Stand: 6: Modified independent (Device/Increase time);With armrests;From chair/3-in-1;With upper extremity assist Stand to Sit: 6: Modified independent (Device/Increase time);With armrests;To chair/3-in-1;With upper extremity assist Ambulation/Gait Ambulation/Gait Assistance: 5: Supervision Ambulation/Gait Assistance Details (indicate cue type and reason): cues for RW placement Ambulation Distance (Feet): 130 Feet Assistive device: Rolling walker Gait Pattern: Step-to pattern Gait velocity: decreased Stairs:  Yes Stairs Assistance: 4: Min assist Stairs Assistance Details (indicate cue type and reason): cues for sequence Stair Management Technique: No rails;Forwards;Step to pattern;With crutches Number of Stairs: 5     Exercise  Total Joint Exercises Ankle Circles/Pumps: AROM;Both;20 reps;Seated Quad Sets: AROM;Both;10 reps;Seated Heel Slides: AAROM;Left;10 reps;Seated Hip ABduction/ADduction: AAROM;Left;10 reps;Seated Straight Leg Raises: AAROM;Left;10 reps;Seated End of Session PT - End of Session Equipment Utilized During Treatment: Left knee immobilizer Activity Tolerance: Patient tolerated treatment well Patient left: in chair;with call bell in reach;with family/visitor present Nurse Communication: Other (comment) (Hiccup issue; RN called MD regarding meds) General Behavior During Session: [facility] for tasks performed Cognition: [facility] for tasks performed  [facility] 07/15/2011, 11:13 AM

## 2011-07-20 DIAGNOSIS — D62 Acute posthemorrhagic anemia: Secondary | ICD-10-CM | POA: Diagnosis not present

## 2011-07-20 NOTE — Discharge Summary (Signed)
Physician Discharge Summary   Patient ID: [name] MRN: [id number] DOB/AGE: [birth year] 66 y.o.  Admit date: 07/12/2011 Discharge date: 07/15/2011  Primary Diagnosis: Osteoarthritis Left Knee   Admission Diagnoses: Past Medical History  Diagnosis Date  . GERD (gastroesophageal reflux disease)   . Arthritis   . Hypertension     stress test 1999/ clearance Dr Chilton Si on chart   EKG 6/12 on chart  . Sleep apnea     last study  2008- MODERATE  Dr Maple Hudson sleep study EPIC   Discharge Diagnoses:  Principal Problem:  *OA (osteoarthritis) of knee Active Problems:  Postop Hypokalemia  Postop Acute blood loss anemia  Procedure: Procedure(s) (LRB): TOTAL KNEE ARTHROPLASTY (Left)   Consults: None  HPI: Harry Leach is a 66 y.o. year old male with end stage OA of his left knee with progressively worsening pain and dysfunction. He has constant pain, with activity and at rest and significant functional deficits with difficulties even with ADLs. He has had extensive non-op management including analgesics, injections of cortisone, and home exercise program, but remains in significant pain with significant dysfunction. Radiographs show bone on bone arthritis medial and patellofemoral with varus deformity. He presents now for left Total Knee Arthroplasty.       Laboratory Data: Hospital Outpatient Visit on 07/05/2011  Component Date Value Range Status  . aPTT (seconds) 07/05/2011 29  24-37 Final  . WBC (K/uL) 07/05/2011 9.3  4.0-10.5 Final  . RBC (MIL/uL) 07/05/2011 4.82  4.22-5.81 Final  . Hemoglobin (g/dL) 40/98/1191 47.8  29.5-62.1 Final  . HCT (%) 07/05/2011 41.2  39.0-52.0 Final  . MCV (fL) 07/05/2011 85.5  78.0-100.0 Final  . MCH (pg) 07/05/2011 30.7  26.0-34.0 Final  . MCHC (g/dL) 30/86/5784 69.6  29.5-28.4 Final  . RDW (%) 07/05/2011 13.8  11.5-15.5 Final  . Platelets (K/uL) 07/05/2011 187  150-400 Final  . Sodium (mEq/L) 07/05/2011 139  135-145 Final  . Potassium  (mEq/L) 07/05/2011 3.0* 3.5-5.1 Final  . Chloride (mEq/L) 07/05/2011 100  96-112 Final  . CO2 (mEq/L) 07/05/2011 29  19-32 Final  . Glucose, Bld (mg/dL) 13/24/4010 272* 53-66 Final  . BUN (mg/dL) 44/07/4740 26* 5-95 Final  . Creatinine, Ser (mg/dL) 63/87/5643 3.29* 5.18-8.41 Final  . Calcium (mg/dL) 66/10/3014 9.8  0.1-09.3 Final  . Total Protein (g/dL) 23/55/7322 7.2  0.2-5.4 Final  . Albumin (g/dL) 27/10/2374 4.2  2.8-3.1 Final  . AST (U/L) 07/05/2011 31  0-37 Final  . ALT (U/L) 07/05/2011 23  0-53 Final  . Alkaline Phosphatase (U/L) 07/05/2011 67  39-117 Final  . Total Bilirubin (mg/dL) 51/76/1607 0.5  3.7-1.0 Final  . GFR calc non Af Amer (mL/min) 07/05/2011 49* >90 Final  . GFR calc Af Amer (mL/min) 07/05/2011 57* >90 Final   Comment:                                 The eGFR has been calculated                          using the CKD EPI equation.                          This calculation has not been                          validated in all  clinical                          situations.                          eGFR's persistently                          <90 mL/min signify                          possible Chronic Kidney Disease.  Marland Kitchen Prothrombin Time (seconds) 07/05/2011 12.9  11.6-15.2 Final  . INR  07/05/2011 0.95  0.00-1.49 Final  . Color, Urine  07/05/2011 YELLOW  YELLOW Final  . APPearance  07/05/2011 CLEAR  CLEAR Final  . Specific Gravity, Urine  07/05/2011 1.028  1.005-1.030 Final  . pH  07/05/2011 6.0  5.0-8.0 Final  . Glucose, UA (mg/dL) 16/02/9603 NEGATIVE  NEGATIVE Final  . Hgb urine dipstick  07/05/2011 NEGATIVE  NEGATIVE Final  . Bilirubin Urine  07/05/2011 NEGATIVE  NEGATIVE Final  . Ketones, ur (mg/dL) 54/01/8118 NEGATIVE  NEGATIVE Final  . Protein, ur (mg/dL) 14/78/2956 NEGATIVE  NEGATIVE Final  . Urobilinogen, UA (mg/dL) 21/30/8657 0.2  8.4-6.9 Final  . Nitrite  07/05/2011 NEGATIVE  NEGATIVE Final  . Leukocytes, UA  07/05/2011 NEGATIVE  NEGATIVE Final    MICROSCOPIC NOT DONE ON URINES WITH NEGATIVE PROTEIN, BLOOD, LEUKOCYTES, NITRITE, OR GLUCOSE <1000 mg/dL.  Marland Kitchen MRSA, PCR  07/05/2011 NEGATIVE  NEGATIVE Final  . Staphylococcus aureus  07/05/2011 NEGATIVE  NEGATIVE Final   Comment:                                 The Xpert SA Assay (FDA                          approved for NASAL specimens                          only), is one component of                          a comprehensive surveillance                          program.  It is not intended                          to diagnose infection nor to                          guide or monitor treatment.   No results found for this basename: HGB:5 in the last 72 hours No results found for this basename: WBC:2,RBC:2,HCT:2,PLT:2 in the last 72 hours No results found for this basename: NA:2,K:2,CL:2,CO2:2,BUN:2,CREATININE:2,GLUCOSE:2,CALCIUM:2 in the last 72 hours No results found for this basename: LABPT:2,INR:2 in the last 72 hours  X-Rays: Chest 2 View  07/05/2011  *RADIOLOGY REPORT*  Clinical Data: Knee osteoarthritis.  Hypertension.  Preop respiratory exam for knee arthroplasty.  CHEST - 2 VIEW  Comparison: None.  Findings: Mild cardiomegaly and ectasia of the thoracic aorta are noted.  Both  lungs are clear.  No evidence of pleural effusion.  No mass or lymphadenopathy identified.  IMPRESSION: Mild cardiomegaly.  No active lung disease.  Original Report Authenticated By: Danae Orleans, M.D.    EKG:No orders found for this or any previous visit.   Hospital Course: Patient was admitted to [facility] and taken to the OR and underwent the above state procedure without complications.  Patient tolerated the procedure well and was later transferred to the recovery room and then to the orthopaedic floor for postoperative care.  They were given PO and IV analgesics for pain control following their surgery.  They were given 24 hours of postoperative antibiotics and started on DVT prophylaxis.   PT  and OT were ordered for total joint protocol.  Discharge planning consulted to help with postop disposition and equipment needs.  Patient had a decent night on the evening of surgery and started to get up with therapy on day one.  PCA was discontinued and they were weaned over to PO meds.  Hemovac drain was pulled without difficulty.  Continued to progress with therapy into day two.  Dressing was changed on day two and the incision was healing well.  By day three, the patient had progressed with therapy and meeting goals.  Incision was healing well.  Patient was seen in rounds and was ready to go home.   Discharge Medications: Prior to Admission medications   Medication Sig Start Date End Date Taking? Authorizing Provider  atenolol-chlorthalidone (TENORETIC) 100-25 MG per tablet Take 1 tablet by mouth daily before breakfast.   Yes Historical Provider, MD  azelastine (ASTELIN) 137 MCG/SPRAY nasal spray Place 1 spray into the nose 2 (two) times daily as needed. Use in each nostril as directed. Allergies    Yes Historical Provider, MD  finasteride (PROSCAR) 5 MG tablet Take 5 mg by mouth daily.   Yes Historical Provider, MD  fluticasone (FLONASE) 50 MCG/ACT nasal spray Place 2 sprays into the nose daily as needed. allergies     Yes Historical Provider, MD  omeprazole (PRILOSEC) 20 MG capsule Take 20 mg by mouth daily.   Yes Historical Provider, MD  Tamsulosin HCl (FLOMAX) 0.4 MG CAPS Take 0.4 mg by mouth 2 (two) times daily.   Yes Historical Provider, MD  chlorproMAZINE (THORAZINE) 25 MG tablet Take 1 tablet (25 mg total) by mouth 3 (three) times daily. 07/15/11 08/14/11  Genelle Gather Babish, PA  Lactase (LACTOSE FAST ACTING RELIEF PO) Take 1 tablet by mouth 2 (two) times daily as needed. take with dairy products     Historical Provider, MD  methocarbamol (ROBAXIN) 500 MG tablet Take 1 tablet (500 mg total) by mouth every 6 (six) hours as needed. 07/14/11 07/24/11  Damaso Laday, PA  oxyCODONE (OXY  IR/ROXICODONE) 5 MG immediate release tablet Take 1-2 tablets (5-10 mg total) by mouth every 4 (four) hours as needed for pain. 07/14/11 07/24/11  Nhyla Nappi, PA  warfarin (COUMADIN) 2.5 MG tablet Take 1 tablet (2.5 mg total) by mouth daily at 6 PM. Take as directed. The dose may need to be adjusted based on INR level to regulate to a therapeutic level between 2.0 and 3.0. 07/14/11 07/13/12  Jahniyah Revere Julien Girt, PA    Diet: heart healthy Activity:WBAT Follow-up:in 2 weeks Disposition: home Discharged Condition: good   Discharge Orders    Future Orders Please Complete By Expires   Diet - low sodium heart healthy      Call MD / Call 911  Comments:   If you experience chest pain or shortness of breath, CALL 911 and be transported to the hospital emergency room.  If you develope a fever above 101 F, pus (white drainage) or increased drainage or redness at the wound, or calf pain, call your surgeon's office.   Constipation Prevention      Comments:   Drink plenty of fluids.  Prune juice may be helpful.  You may use a stool softener, such as Colace (over the counter) 100 mg twice a day.  Use MiraLax (over the counter) for constipation as needed.   Increase activity slowly as tolerated      Weight Bearing as taught in Physical Therapy      Comments:   Use a walker or crutches as instructed.   Discharge instructions      Comments:   Pick up stool softner and laxative for home. Do not submerge incision under water. May shower. Continue to use ice for pain and swelling from surgery.    Driving restrictions      Comments:   No driving   Lifting restrictions      Comments:   No lifting   TED hose      Comments:   Use stockings (TED hose) for 3 weeks on both leg(s).  You may remove them at night for sleeping.   Change dressing      Comments:   Change dressing daily with sterile 4 x 4 inch gauze dressing and apply TED hose.   Do not put a pillow under the knee. Place it under the  heel.        Medication List  As of 07/20/2011 10:23 AM   STOP taking these medications         mulitivitamin with minerals Tabs         TAKE these medications         atenolol-chlorthalidone 100-25 MG per tablet   Commonly known as: TENORETIC   Take 1 tablet by mouth daily before breakfast.      azelastine 137 MCG/SPRAY nasal spray   Commonly known as: ASTELIN   Place 1 spray into the nose 2 (two) times daily as needed. Use in each nostril as directed. Allergies        chlorproMAZINE 25 MG tablet   Commonly known as: THORAZINE   Take 1 tablet (25 mg total) by mouth 3 (three) times daily.      finasteride 5 MG tablet   Commonly known as: PROSCAR   Take 5 mg by mouth daily.      fluticasone 50 MCG/ACT nasal spray   Commonly known as: FLONASE   Place 2 sprays into the nose daily as needed. allergies          LACTOSE FAST ACTING RELIEF PO   Take 1 tablet by mouth 2 (two) times daily as needed. take with dairy products        methocarbamol 500 MG tablet   Commonly known as: ROBAXIN   Take 1 tablet (500 mg total) by mouth every 6 (six) hours as needed.      omeprazole 20 MG capsule   Commonly known as: PRILOSEC   Take 20 mg by mouth daily.      oxyCODONE 5 MG immediate release tablet   Commonly known as: Oxy IR/ROXICODONE   Take 1-2 tablets (5-10 mg total) by mouth every 4 (four) hours as needed for pain.      Tamsulosin HCl 0.4 MG Caps  Commonly known as: FLOMAX   Take 0.4 mg by mouth 2 (two) times daily.      warfarin 2.5 MG tablet   Commonly known as: COUMADIN   Take 1 tablet (2.5 mg total) by mouth daily at 6 PM. Take as directed. The dose may need to be adjusted based on INR level to regulate to a therapeutic level between 2.0 and 3.0.           Follow-up Information    Follow up with Loanne Drilling, MD. Schedule an appointment as soon as possible for a visit in 2 weeks. (Follow up on Tuesday 3/12 or Thursday 3/14)    Contact information:    [facility] [address] [place] [address] [phone]          Signed: Patrica Duel 07/20/2011, 10:23 AM

## 2011-10-03 ENCOUNTER — Encounter: Payer: Self-pay | Admitting: Internal Medicine

## 2011-10-04 ENCOUNTER — Encounter: Payer: Self-pay | Admitting: Internal Medicine

## 2011-10-04 ENCOUNTER — Ambulatory Visit (INDEPENDENT_AMBULATORY_CARE_PROVIDER_SITE_OTHER): Payer: BC Managed Care – PPO | Admitting: Internal Medicine

## 2011-10-04 VITALS — BP 122/74 | HR 56 | Ht 67.0 in | Wt 201.2 lb

## 2011-10-04 DIAGNOSIS — J3089 Other allergic rhinitis: Secondary | ICD-10-CM

## 2011-10-04 DIAGNOSIS — K219 Gastro-esophageal reflux disease without esophagitis: Secondary | ICD-10-CM

## 2011-10-04 DIAGNOSIS — G4733 Obstructive sleep apnea (adult) (pediatric): Secondary | ICD-10-CM

## 2011-10-04 DIAGNOSIS — J309 Allergic rhinitis, unspecified: Secondary | ICD-10-CM

## 2011-10-04 DIAGNOSIS — J302 Other seasonal allergic rhinitis: Secondary | ICD-10-CM

## 2011-10-04 NOTE — Progress Notes (Signed)
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 Last here in 2010. PCP Dr Elmore Guise. NPSG 07/15/06- AHI 36.7/ hr He has been using CPAP 11/[facility]. Having more problems sleeping. Pulses then turns, flipping side to side. Knee pain and allergic nasal congestion as well as bathroom waking repeatedly at night. Or trouble this year with seasonal allergic rhinitis treated with Flonase and Astelin nasal sprays. He has to take the mask off or delay putting it on if his nose is bothering him. Throat clearing bothers him. He is on omeprazole and does not feel reflux events. Usual bedtime between midnight and 1 AM. Sleep latency 10 minutes. Wakes between 3 and 6 times per night before up to 8 AM. 5 or 10 pound weight gain in the last 2 years.  Prior to Admission medications   Medication Sig Start Date End Date Taking? Authorizing Provider  aspirin 81 MG tablet Take 81 mg by mouth daily.   Yes Historical Provider, MD  atenolol-chlorthalidone (TENORETIC) 100-25 MG per tablet Take 1 tablet by mouth daily before breakfast.   Yes Historical Provider, MD  azelastine (ASTELIN) 137 MCG/SPRAY nasal spray Place 1 spray into the nose 2 (two) times daily as needed. Use in each nostril as directed. Allergies    Yes Historical Provider, MD  finasteride (PROSCAR) 5 MG tablet Take 5 mg by mouth daily.   Yes Historical Provider, MD  fluticasone (FLONASE) 50 MCG/ACT nasal spray Place 2 sprays into the nose daily as needed. allergies     Yes Historical Provider, MD  Lactase (LACTOSE FAST ACTING RELIEF PO) Take 1 tablet by mouth 2 (two) times daily as needed. take with dairy products    Yes Historical Provider, MD  Multiple Vitamin (MULTIVITAMIN) tablet Take 1 tablet by mouth daily.   Yes Historical Provider, MD  omeprazole (PRILOSEC) 20 MG capsule Take 20 mg by mouth daily.   Yes Historical Provider, MD  Tamsulosin HCl (FLOMAX) 0.4 MG CAPS Take 0.4 mg by mouth 2 (two) times  daily.   Yes Historical Provider, MD   Past Medical History  Diagnosis Date  . GERD (gastroesophageal reflux disease)   . Arthritis   . Hypertension     stress test 1999/ clearance Dr Chilton Si on chart   EKG 6/12 on chart  . Sleep apnea     last study  2008- [facility]  Dr Maple Hudson sleep study EPIC   Past Surgical History  Procedure Date  . Knee arthroscopy     x 3   with ACL repair--  last 1999  . Tonsillectomy   . Colonoscopy     x 3/   polypectomy x 1  . Fracture surgery     arm, fingers as a teen  . 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 Parkinsonism Mother   . Diabetes Mother   . Prostate cancer Father   . Heart disease Father    History   Social History  . Marital Status: Married    Spouse Name: N/A    Number of Children: 2  . Years of Education: N/A   Occupational History  .     Social History Main Topics  . Smoking status: Former Smoker -- 0.2 packs/day    Types: Cigarettes, Pipe    Quit  date: 07/05/1991  . Smokeless tobacco: Not on file   Comment: cigs in college,  . Alcohol Use: 1.8 oz/week    3 Cans of beer per week     2-4 beers week  . Drug Use: No  . Sexually Active: Not on file   Other Topics Concern  . Not on file   Social History Narrative  . No narrative on file   ROS-see HPI Constitutional:   No-   weight loss, night sweats, fevers, chills, +fatigue, lassitude. HEENT:   No-  headaches, difficulty swallowing, tooth/dental problems, sore throat,       No-  sneezing, itching, ear ache, nasal congestion, post nasal drip,  CV:  No-   chest pain, orthopnea, PND, swelling in lower extremities, anasarca, dizziness, palpitations Resp: No-   shortness of breath with exertion or at rest.              No-   productive cough,  No non-productive cough,  No- coughing up of blood.                No-   change in color of mucus.  No- wheezing.   Skin: No-   rash or lesions. GI:  No-   heartburn, indigestion, abdominal pain, nausea, vomiting,  GU:  MS:  No-   joint pain or swelling.  . Neuro-     nothing unusual Psych:  No- change in mood or affect. No depression or anxiety.  No memory loss.  OBJ- Physical Exam General- Alert, Oriented, Affect-appropriate, Distress- none acute Skin- rash-none, lesions- none, excoriation- none Lymphadenopathy- none Head- atraumatic            Eyes- Gross vision intact, PERRLA, conjunctivae and secretions clear            Ears- Hearing, canals-normal            Nose- Clear, no-Septal dev, mucus, polyps, erosion, perforation             Throat- Mallampati IV , mucosa clear , drainage- none, tonsils- atrophic. Throat clearing. Neck- flexible , trachea midline, no stridor , thyroid nl, carotid no bruit Chest - symmetrical excursion , unlabored           Heart/CV- RRR , no murmur , no gallop  , no rub, nl s1 s2                           - JVD- none , edema- none, stasis changes- none, varices- none           Lung- clear to P&A, wheeze- none, cough- none , dullness-none, rub- none           Chest wall-  Abd-  Br/ Gen/ Rectal- Not done, not indicated Extrem- cyanosis- none, clubbing, none, atrophy- none, strength- nl Neuro- grossly intact to observation

## 2011-10-04 NOTE — Patient Instructions (Signed)
Ask [facility] if your are yet a candidate for a replacementt CPAP machine.  Sample Dymista nasal spray-   1 or 2 sprays each nostril every night at bedtime.  We are looking for cumulative effect over time.  Try taking the omeprazole twice daily before meals for a couple of weeks. See if this impacts the throat clearing, which can be a sign of silent reflux.

## 2011-10-09 NOTE — Assessment & Plan Note (Signed)
Good compliance and control. Sleep is disturbed more by pain with knee and nasal congestion. He will ask [facility] if he is eligible for a new CPAP machine yet. Pressure will stay at 11

## 2011-10-09 NOTE — Assessment & Plan Note (Signed)
Try sample of time Dymista spray used regularly.

## 2011-10-09 NOTE — Assessment & Plan Note (Signed)
Reviewed GERD precautions. Try using omeprazole twice daily before meals.

## 2011-10-20 ENCOUNTER — Encounter: Payer: Self-pay | Admitting: Internal Medicine

## 2011-11-14 ENCOUNTER — Other Ambulatory Visit: Payer: BC Managed Care – PPO

## 2011-11-14 ENCOUNTER — Ambulatory Visit (INDEPENDENT_AMBULATORY_CARE_PROVIDER_SITE_OTHER): Payer: BC Managed Care – PPO | Admitting: Internal Medicine

## 2011-11-14 ENCOUNTER — Encounter: Payer: Self-pay | Admitting: Internal Medicine

## 2011-11-14 VITALS — BP 114/68 | HR 53 | Ht 67.0 in | Wt 201.6 lb

## 2011-11-14 DIAGNOSIS — J3089 Other allergic rhinitis: Secondary | ICD-10-CM

## 2011-11-14 DIAGNOSIS — G4733 Obstructive sleep apnea (adult) (pediatric): Secondary | ICD-10-CM

## 2011-11-14 DIAGNOSIS — J309 Allergic rhinitis, unspecified: Secondary | ICD-10-CM

## 2011-11-14 DIAGNOSIS — K219 Gastro-esophageal reflux disease without esophagitis: Secondary | ICD-10-CM

## 2011-11-14 DIAGNOSIS — J302 Other seasonal allergic rhinitis: Secondary | ICD-10-CM

## 2011-11-14 NOTE — Patient Instructions (Addendum)
Continue CPAP 11/ [facility]      Watch for the possibility that you reflux gastric jusice from time to time. It may just be felt as sudden liquid in the back of the throat with or without acid/ acrid burning. It may just manifest as sudden waking with cough or choking during sleep.   Order- Allergy profile     Allergic rhinitis

## 2011-11-14 NOTE — Progress Notes (Signed)
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 Last here in 2010. PCP Dr Elmore Guise. NPSG 07/15/06- AHI 36.7/ hr He has been using CPAP 11/[facility]. Having more problems sleeping. Pulses then turns, flipping side to side. Knee pain and allergic nasal congestion as well as bathroom waking repeatedly at night. Or trouble this year with seasonal allergic rhinitis treated with Flonase and Astelin nasal sprays. He has to take the mask off or delay putting it on if his nose is bothering him. Throat clearing bothers him. He is on omeprazole and does not feel reflux events. Usual bedtime between midnight and 1 AM. Sleep latency 10 minutes. Wakes between 3 and 6 times per night before up to 8 AM. 5 or 10 pound weight gain in the last 2 years.  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 Last here in 2010 . PCP Dr Elmore Guise. Wears CPAP every night for approximately 5 hours and pressure working well. CPAP 11/[facility]. Machine is at least 64 years old. We discussed timing of replacement and suggested he replace it for wears out so that he has a functioning spare machine.  history of allergic rhinitis. He is satisfied to continue with Astelin and Flonase nasal sprays. He saw no advantage to Dymista.  ROS-see HPI Constitutional:   No-   weight loss, night sweats, fevers, chills, fatigue, lassitude. HEENT:   No-  headaches, difficulty swallowing, tooth/dental problems, sore throat,       No-  sneezing, itching, ear ache, +occasional nasal congestion, post nasal drip,  CV:  No-   chest pain, orthopnea, PND, swelling in lower extremities, anasarca, dizziness, palpitations Resp: No-   shortness of breath with exertion or at rest.              No-   productive cough,  No non-productive cough,  No- coughing up of blood.              No-   change in color of mucus.  No- wheezing.   Skin: No-   rash or  lesions. GI:  No-   heartburn, indigestion, abdominal pain, nausea, vomiting,  GU:  MS:  No-   joint pain or swelling.  . Neuro-     nothing unusual Psych:  No- change in mood or affect. No depression or anxiety.  No memory loss.  OBJ- Physical Exam General- Alert, Oriented, Affect-appropriate, Distress- none acute Skin- rash-none, lesions- none, excoriation- none Lymphadenopathy- none Head- atraumatic            Eyes- Gross vision intact, PERRLA, conjunctivae and secretions clear            Ears- Hearing, canals-normal            Nose- Clear, no-Septal dev, mucus, polyps, erosion, perforation             Throat- Mallampati IV , mucosa clear , drainage- none, tonsils- atrophic. Throat clearing again noted. Neck- flexible , trachea midline, no stridor , thyroid nl, carotid no bruit Chest - symmetrical excursion , unlabored           Heart/CV- RRR , no murmur , no gallop  , no rub, nl s1 s2                           - JVD- none , edema- none, stasis changes- none,  varices- none           Lung- clear to P&A, wheeze- none, cough- none , dullness-none, rub- none           Chest wall-  Abd-  Br/ Gen/ Rectal- Not done, not indicated Extrem- cyanosis- none, clubbing, none, atrophy- none, strength- nl Neuro- grossly intact to observation

## 2011-11-17 LAB — ALLERGY FULL PROFILE
Alternaria Alternata: <0.1 kU/L (ref <0.35)
Aspergillus fumigatus, IgG: <0.1 kU/L (ref <0.35)
Bahia Grass: 3.18 kU/L — ABNORMAL HIGH (ref <0.35)
Cat Dander: <0.1 kU/L (ref <0.35)
Common Ragweed: 0.74 kU/L — ABNORMAL HIGH (ref <0.35)
D. farinae: <0.1 kU/L (ref <0.35)
Dog Dander: <0.1 kU/L (ref <0.35)
Elm IgE: <0.1 kU/L (ref <0.35)
G009 Red Top: 5.68 kU/L — ABNORMAL HIGH (ref <0.35)
Helminthosporium halodes: <0.1 kU/L (ref <0.35)
House Dust Hollister: <0.1 kU/L (ref <0.35)
Lamb's Quarters: 0.1 kU/L (ref <0.35)
Plantain: <0.1 kU/L (ref <0.35)
Sycamore Tree: <0.1 kU/L (ref <0.35)

## 2011-11-19 NOTE — Assessment & Plan Note (Signed)
He reports excellent compliance and control with CPAP. It does not sound as if he needs a pressure change.

## 2011-11-19 NOTE — Assessment & Plan Note (Signed)
We reviewed symptoms potentially associated with esophageal reflux and basic prevention. He may want to explore this more with Dr. Chilton Si

## 2011-11-19 NOTE — Assessment & Plan Note (Signed)
I suggested his throat clearing might be either from postnasal drainage or reflux. He had been on allergy vaccine in the past. He does not recognize sneezing or itching. Plan-allergy profile sent

## 2011-11-24 NOTE — Progress Notes (Signed)
Quick Note:  Pt aware of results. ______ 

## 2011-12-19 ENCOUNTER — Telehealth: Payer: Self-pay | Admitting: Gastroenterology

## 2011-12-19 NOTE — Telephone Encounter (Signed)
How were the records sent?

## 2011-12-19 NOTE — Telephone Encounter (Signed)
Forwarded 6 pages from Erskine Speed to Dr. Melvia Heaps for review on 12-19-11 ym

## 2011-12-20 NOTE — Telephone Encounter (Signed)
Patient is calling to see if we have received the records from Dr. Chilton Si because he is wanting to set up his colonoscopy.

## 2011-12-20 NOTE — Telephone Encounter (Signed)
Do wehave a copy of the old colonoscopy report?

## 2011-12-20 NOTE — Telephone Encounter (Signed)
It is in your inbox.

## 2011-12-20 NOTE — Telephone Encounter (Signed)
Pt is due for colon recall in 2014. Per Dr. Chilton Si pt had abnormal stool cards. Records on your desk for review. Please advise.

## 2011-12-21 ENCOUNTER — Other Ambulatory Visit: Payer: Self-pay | Admitting: Gastroenterology

## 2011-12-21 ENCOUNTER — Telehealth: Payer: Self-pay

## 2011-12-21 NOTE — Telephone Encounter (Signed)
See additional phone note. 

## 2011-12-21 NOTE — Telephone Encounter (Signed)
Message copied by Chrystie Nose on Thu Dec 21, 2011  4:54 PM ------      Message from: Melvia Heaps D      Created: Wed Dec 20, 2011  4:48 PM       Patient has Hemoccult-positive stools. He needs to be scheduled for colonoscopy

## 2011-12-21 NOTE — Telephone Encounter (Signed)
Pt scheduled for colon with Dr. Arlyce Dice in September to hold him a slot. Let pt know I will call him back when I hear from Dr. Arlyce Dice as to if he needs an OV or if he even needs the colon. Pts cell [phone].

## 2011-12-21 NOTE — Telephone Encounter (Signed)
Per Dr. Arlyce Dice pt needs colon due to positive stool cards. Pt scheduled for previsit for 01/03/12@4 :30pm. Colon scheduled for September 17th at 1:30pm. Pt aware of appt dates and times.

## 2012-01-03 ENCOUNTER — Ambulatory Visit (AMBULATORY_SURGERY_CENTER): Payer: BC Managed Care – PPO | Admitting: *Deleted

## 2012-01-03 VITALS — Ht 67.0 in | Wt 205.1 lb

## 2012-01-03 DIAGNOSIS — R195 Other fecal abnormalities: Secondary | ICD-10-CM

## 2012-01-03 MED ORDER — NA SULFATE-K SULFATE-MG SULF 17.5-3.13-1.6 GM/177ML PO SOLN: 1.0000 | Freq: Once | ORAL | Status: DC

## 2012-01-03 NOTE — Progress Notes (Signed)
No allergies to eggs or soy products 

## 2012-01-30 ENCOUNTER — Encounter: Payer: Self-pay | Admitting: Gastroenterology

## 2012-01-30 ENCOUNTER — Ambulatory Visit (AMBULATORY_SURGERY_CENTER): Payer: BC Managed Care – PPO | Admitting: Gastroenterology

## 2012-01-30 VITALS — BP 113/65 | HR 49 | Temp 98.7°F | Resp 14 | Ht 67.0 in | Wt 205.0 lb

## 2012-01-30 DIAGNOSIS — D126 Benign neoplasm of colon, unspecified: Secondary | ICD-10-CM

## 2012-01-30 DIAGNOSIS — R195 Other fecal abnormalities: Secondary | ICD-10-CM

## 2012-01-30 DIAGNOSIS — K5730 Diverticulosis of large intestine without perforation or abscess without bleeding: Secondary | ICD-10-CM

## 2012-01-30 MED ORDER — SODIUM CHLORIDE 0.9 % IV SOLN: 500.0000 mL | INTRAVENOUS | Status: DC

## 2012-01-30 NOTE — Op Note (Signed)
[facility] [address] [place], [address]   COLONOSCOPY PROCEDURE REPORT  PATIENT: Harry, Leach  MR#: [id number] BIRTHDATE: March 12, 1946 , 66  yrs. old GENDER: Male ENDOSCOPIST: Louis Meckel, MD REFERRED HQ:IONGE Chilton Si, M.D. PROCEDURE DATE:  01/30/2012 PROCEDURE:   Colonoscopy with snare polypectomy and Colonoscopy with cold biopsy polypectomy ASA CLASS:   Class II INDICATIONS:heme-positive stool. MEDICATIONS: MAC sedation, administered by CRNA and Propofol (Diprivan) 350 mg IV  DESCRIPTION OF PROCEDURE:   After the risks benefits and alternatives of the procedure were thoroughly explained, informed consent was obtained.  A digital rectal exam revealed no abnormalities of the rectum.   The LB [id number]  endoscope was introduced through the anus and advanced to the cecum, which was identified by both the appendix and ileocecal valve. No adverse events experienced.   The quality of the prep was Suprep excellent The instrument was then slowly withdrawn as the colon was fully examined.      COLON FINDINGS: Sessile 2mm polyp in the cecum - removed with cold biopsy forceps.  Single 3mm pedunculated polyp in the descending colon - removed with cold polypectomy snare and submitted to pathology.  Two sessile polyps measuring 2-3 mm in size were found in the proximal sigmoid colon.  A polypectomy was performed using snare cautery and using hot forceps, respectively. All polyps were submitted to pathology.  Moderate diverticulosis was noted in the sigmoid colon.   Mild diverticulosis was noted in the ascending colon.  Retroflexed views revealed no abnormalities. The time to cecum=3 minutes 01 seconds.  Withdrawal time=20 minutes 01 seconds. The scope was withdrawn and the procedure completed. COMPLICATIONS: There were no complications.  ENDOSCOPIC IMPRESSION: 1.   Two sessile polyps measuring 2-3 mm in size were found in the sigmoid colon;  polypectomy was performed using snare cautery and using hot forceps 2.   Moderate diverticulosis was noted in the sigmoid colon 3.   Mild diverticulosis was noted in the ascending colon  RECOMMENDATIONS: 1.  If the polyp(s) removed today are proven to be adenomatous (pre-cancerous) polyps, you will need a repeat colonoscopy in 5 years.  Otherwise you should continue to follow colorectal cancer screening guidelines for "routine risk" patients with colonoscopy in 10 years.  You will receive a letter within 1-2 weeks with the results of your biopsy as well as final recommendations.  Please call my office if you have not received a letter after 3 weeks. 2.  followup hemeoccults in 10 days.   eSigned:  Louis Meckel, MD 01/30/2012 2:36 PM   cc:   PATIENT NAME:  Harry, Leach MR#: [id number]

## 2012-01-30 NOTE — Patient Instructions (Signed)
YOU HAD AN ENDOSCOPIC PROCEDURE TODAY AT THE [facility]: Refer to the procedure report that was given to you for any specific questions about what was found during the examination.  If the procedure report does not answer your questions, please call your gastroenterologist to clarify.  If you requested that your care partner not be given the details of your procedure findings, then the procedure report has been included in a sealed envelope for you to review at your convenience later.  YOU SHOULD EXPECT: Some feelings of bloating in the abdomen. Passage of more gas than usual.  Walking can help get rid of the air that was put into your GI tract during the procedure and reduce the bloating. If you had a lower endoscopy (such as a colonoscopy or flexible sigmoidoscopy) you may notice spotting of blood in your stool or on the toilet paper. If you underwent a bowel prep for your procedure, then you may not have a normal bowel movement for a few days.  DIET: Your first meal following the procedure should be a light meal and then it is ok to progress to your normal diet.  A half-sandwich or bowl of soup is an example of a good first meal.  Heavy or fried foods are harder to digest and may make you feel nauseous or bloated.  Likewise meals heavy in dairy and vegetables can cause extra gas to form and this can also increase the bloating.  Drink plenty of fluids but you should avoid alcoholic beverages for 24 hours.  ACTIVITY: Your care partner should take you home directly after the procedure.  You should plan to take it easy, moving slowly for the rest of the day.  You can resume normal activity the day after the procedure however you should NOT DRIVE or use heavy machinery for 24 hours (because of the sedation medicines used during the test).    SYMPTOMS TO REPORT IMMEDIATELY: A gastroenterologist can be reached at any hour.  During normal business hours, 8:30 AM to 5:00 PM Monday through Friday,  call [phone].  After hours and on weekends, please call the GI answering service at [phone] who will take a message and have the physician on call contact you.   Following lower endoscopy (colonoscopy or flexible sigmoidoscopy):  Excessive amounts of blood in the stool  Significant tenderness or worsening of abdominal pains  Swelling of the abdomen that is new, acute  Fever of 100F or higher    FOLLOW UP: If any biopsies were taken you will be contacted by phone or by letter within the next 1-3 weeks.  Call your gastroenterologist if you have not heard about the biopsies in 3 weeks.  Our staff will call the home number listed on your records the next business day following your procedure to check on you and address any questions or concerns that you may have at that time regarding the information given to you following your procedure. This is a courtesy call and so if there is no answer at the home number and we have not heard from you through the emergency physician on call, we will assume that you have returned to your regular daily activities without incident.  SIGNATURES/CONFIDENTIALITY: You and/or your care partner have signed paperwork which will be entered into your electronic medical record.  These signatures attest to the fact that that the information above on your After Visit Summary has been reviewed and is understood.  Full responsibility of the confidentiality   of this discharge information lies with you and/or your care-partner.     INFORMATION ON POLYPS,DIVERTICULOSIS, & HIGH FIBER DIET GIVEN TO YOU TODAY.   HEMOCCULTS IN 10 DAYS.(CARDS WITH INSTRUCTIONS GIVEN TO YOU TODAY)

## 2012-01-31 ENCOUNTER — Telehealth: Payer: Self-pay | Admitting: *Deleted

## 2012-01-31 NOTE — Telephone Encounter (Signed)
  Follow up Call-  Call back number 01/30/2012  Post procedure Call Back phone  # [phone]  Permission to leave phone message Yes     Patient questions:  Do you have a fever, pain , or abdominal swelling? no Pain Score  0 *  Have you tolerated food without any problems? yes  Have you been able to return to your normal activities? yes  Do you have any questions about your discharge instructions: Diet   no Medications  no Follow up visit  no  Do you have questions or concerns about your Care? no  Actions: * If pain score is 4 or above: No action needed, pain <4.

## 2012-02-06 ENCOUNTER — Encounter: Payer: Self-pay | Admitting: Gastroenterology

## 2012-08-29 ENCOUNTER — Telehealth: Payer: Self-pay | Admitting: Internal Medicine

## 2012-08-29 DIAGNOSIS — G4733 Obstructive sleep apnea (adult) (pediatric): Secondary | ICD-10-CM

## 2012-08-29 NOTE — Telephone Encounter (Signed)
CPAP machine does work according to patient but piece over water chamber is broken and has to be held together with rubber band. Pt needs order sent to [facility] to have replacement CPAP machine, new mask of choice, and supplies. Pt aware we are sending to [facility] to give okay for new order and will give to me to take care of.

## 2012-08-29 NOTE — Telephone Encounter (Signed)
Ok order- replacement for old and broken CPAP machine, 11 cwp, mask of choice, humidifier, supplies,    Dx OSA

## 2012-09-02 NOTE — Telephone Encounter (Signed)
Order has been placed and patient to await a call from [facility] about new CPAP machine.

## 2012-10-30 ENCOUNTER — Encounter: Payer: Self-pay | Admitting: Cardiovascular Disease

## 2012-10-30 ENCOUNTER — Other Ambulatory Visit (HOSPITAL_COMMUNITY): Payer: Self-pay | Admitting: Internal Medicine

## 2012-10-30 DIAGNOSIS — R079 Chest pain, unspecified: Secondary | ICD-10-CM

## 2012-11-01 ENCOUNTER — Ambulatory Visit (HOSPITAL_COMMUNITY)
Admission: RE | Admit: 2012-11-01 | Discharge: 2012-11-01 | Disposition: A | Discharge status: Home / Self Care | Payer: Medicare Other | Source: Ambulatory Visit | Attending: Internal Medicine | Admitting: Internal Medicine

## 2012-11-01 DIAGNOSIS — R079 Chest pain, unspecified: Secondary | ICD-10-CM | POA: Insufficient documentation

## 2012-11-02 ENCOUNTER — Encounter (HOSPITAL_COMMUNITY): Payer: Self-pay

## 2012-11-13 ENCOUNTER — Encounter: Payer: Self-pay | Admitting: Internal Medicine

## 2012-11-13 ENCOUNTER — Ambulatory Visit (INDEPENDENT_AMBULATORY_CARE_PROVIDER_SITE_OTHER): Payer: Medicare Other | Admitting: Internal Medicine

## 2012-11-13 VITALS — BP 124/82 | HR 47 | Ht 65.75 in | Wt 210.1 lb

## 2012-11-13 DIAGNOSIS — J3089 Other allergic rhinitis: Secondary | ICD-10-CM

## 2012-11-13 DIAGNOSIS — J309 Allergic rhinitis, unspecified: Secondary | ICD-10-CM

## 2012-11-13 DIAGNOSIS — G4733 Obstructive sleep apnea (adult) (pediatric): Secondary | ICD-10-CM

## 2012-11-13 MED ORDER — FLUCONAZOLE 150 MG PO TABS: 150.0000 mg | ORAL_TABLET | Freq: Once | ORAL | Status: DC

## 2012-11-13 MED ORDER — FLUTICASONE PROPIONATE 50 MCG/ACT NA SUSP: NASAL | Status: AC

## 2012-11-13 MED ORDER — AZELASTINE HCL 0.1 % NA SOLN: NASAL | Status: DC

## 2012-11-13 NOTE — Patient Instructions (Addendum)
Refill scripts sent for Astelin and Fluticasone nasal sprays  Script sent for Diflucan tabs Take one daily x 3 days. See if that clears your throat, which would be a clue that the irritation is from persistent yeast in your throat "thrush"  We can continue CPAP 11/ [facility]  Please call as needed

## 2012-11-13 NOTE — Progress Notes (Signed)
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 Last here in 2010. PCP Dr Elmore Guise. NPSG 07/15/06- AHI 36.7/ hr He has been using CPAP 11/[facility]. Having more problems sleeping. Pulses then turns, flipping side to side. Knee pain and allergic nasal congestion as well as bathroom waking repeatedly at night. Or trouble this year with seasonal allergic rhinitis treated with Flonase and Astelin nasal sprays. He has to take the mask off or delay putting it on if his nose is bothering him. Throat clearing bothers him. He is on omeprazole and does not feel reflux events. Usual bedtime between midnight and 1 AM. Sleep latency 10 minutes. Wakes between 3 and 6 times per night before up to 8 AM. 5 or 10 pound weight gain in the last 2 years.  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 Last here in 2010 . PCP Dr Elmore Guise. Wears CPAP every night for approximately 5 hours and pressure working well. CPAP 11/[facility]. Machine is at least 65 years old. We discussed timing of replacement and suggested he replace it for wears out so that he has a functioning spare machine.  history of allergic rhinitis. He is satisfied to continue with Astelin and Flonase nasal sprays. He saw no advantage to Dymista.  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 Hx allergic rhinitis Has a new cpap 11/ [facility] and it's working well, wearing every night, has petite nose mask.  Being evaluated for ?angina. Discussed OSA and cardiac disease. Dry irritation back of throat.  ROS-see HPI Constitutional:   No-   weight loss, night sweats, fevers, chills, fatigue, lassitude. HEENT:   No-  headaches, difficulty swallowing, tooth/dental problems, sore throat,       No-  sneezing, itching, ear ache, +occasional nasal congestion, +post nasal drip,  CV:  No-   chest pain, orthopnea, PND,  swelling in lower extremities, anasarca, dizziness, palpitations Resp: No-   shortness of breath with exertion or at rest.              No-   productive cough,  No non-productive cough,  No- coughing up of blood.              No-   change in color of mucus.  No- wheezing.   Skin: No-   rash or lesions. GI:  No-   heartburn, indigestion, abdominal pain, nausea, vomiting,  GU:  MS:  No-   joint pain or swelling.  . Neuro-     nothing unusual Psych:  No- change in mood or affect. No depression or anxiety.  No memory loss.  OBJ- Physical Exam General- Alert, Oriented, Affect-appropriate, Distress- none acute Skin- rash-none, lesions- none, excoriation- none Lymphadenopathy- none Head- atraumatic            Eyes- Gross vision intact, PERRLA, conjunctivae and secretions clear            Ears- Hearing, canals-normal            Nose- Clear, no-Septal dev, mucus, polyps, erosion, perforation             Throat- Mallampati IV , mucosa? thrush , drainage- none, tonsils- atrophic. Throat clearing again noted. Neck- flexible , trachea midline, no stridor , thyroid nl, carotid no bruit Chest - symmetrical excursion , unlabored           Heart/CV- RRR ,  no murmur , no gallop  , no rub, nl s1 s2                           - JVD- none , edema- none, stasis changes- none, varices- none           Lung- clear to P&A, wheeze- none, cough- none , dullness-none, rub- none           Chest wall-  Abd-  Br/ Gen/ Rectal- Not done, not indicated Extrem- cyanosis- none, clubbing, none, atrophy- none, strength- nl Neuro- grossly intact to observation

## 2012-11-25 ENCOUNTER — Ambulatory Visit (INDEPENDENT_AMBULATORY_CARE_PROVIDER_SITE_OTHER): Payer: Medicare Other | Admitting: Cardiovascular Disease

## 2012-11-25 ENCOUNTER — Encounter: Payer: Self-pay | Admitting: *Deleted

## 2012-11-25 ENCOUNTER — Encounter: Payer: Self-pay | Admitting: Cardiovascular Disease

## 2012-11-25 VITALS — BP 136/80 | HR 58 | Ht 67.0 in | Wt 204.0 lb

## 2012-11-25 DIAGNOSIS — R9439 Abnormal result of other cardiovascular function study: Secondary | ICD-10-CM

## 2012-11-25 DIAGNOSIS — I10 Essential (primary) hypertension: Secondary | ICD-10-CM

## 2012-11-25 DIAGNOSIS — R079 Chest pain, unspecified: Secondary | ICD-10-CM

## 2012-11-25 DIAGNOSIS — E785 Hyperlipidemia, unspecified: Secondary | ICD-10-CM

## 2012-11-25 NOTE — Progress Notes (Signed)
Patient ID: Harry Leach, male   DOB: [birth year], 67 y.o.   MRN: [id number] 67 yo no history of CAD  Elevated lipids and HTN.  Has had chest pressure for 2 years.  Can radiate to left arm and shoulder Relates this to GI symptoms and gas.  Has been helped by omeprazole.  Can do yard work and tennis with no pressure.  Pressure not related to activity.  Reviewed ETT done by Dr Elmore Guise 08/10/12  Exercised 9 minutes on Bruce protocol.  Max HR 150 no chest pain 1.5 mm ST segment depression in V 56  ROS: Denies fever, malais, weight loss, blurry vision, decreased visual acuity, cough, sputum, SOB, hemoptysis, pleuritic pain, palpitaitons, heartburn, abdominal pain, melena, lower extremity edema, claudication, or rash.  All other systems reviewed and negative   General: Affect appropriate Healthy:  appears stated age HEENT: normal Neck supple with no adenopathy JVP normal no bruits no thyromegaly Lungs clear with no wheezing and good diaphragmatic motion Heart:  S1/S2 no murmur,rub, gallop or click PMI normal Abdomen: benighn, BS positve, no tenderness, no AAA no bruit.  No HSM or HJR Distal pulses intact with no bruits No edema Neuro non-focal Skin warm and dry No muscular weakness  Medications Current Outpatient Prescriptions  Medication Sig Dispense Refill  . aspirin 81 MG tablet Take 81 mg by mouth daily.      Marland Kitchen atenolol-chlorthalidone (TENORETIC) 100-25 MG per tablet Take 1 tablet by mouth daily before breakfast.      . azelastine (ASTELIN) 137 MCG/SPRAY nasal spray 1-2 puffs each nostril once or twice daily when needed  30 mL  prn  . finasteride (PROSCAR) 5 MG tablet Take 5 mg by mouth daily.      . fluconazole (DIFLUCAN) 150 MG tablet Take 1 tablet (150 mg total) by mouth once. Daily x 3 days  3 tablet  0  . fluticasone (FLONASE) 50 MCG/ACT nasal spray 1-2 puffs each nostril once daily at bedtime  16 g  prn  . IBUPROFEN PO Take by mouth as needed.      . Lactase (LACTOSE FAST  ACTING RELIEF PO) Take 1 tablet by mouth 2 (two) times daily as needed. take with dairy products       . Multiple Vitamin (MULTIVITAMIN) tablet Take 1 tablet by mouth daily.      Marland Kitchen omeprazole (PRILOSEC) 20 MG capsule Take 20 mg by mouth daily.      . Tamsulosin HCl (FLOMAX) 0.4 MG CAPS Take 0.4 mg by mouth 2 (two) times daily.       No current facility-administered medications for this visit.    Allergies Review of patient's allergies indicates no known allergies.  Family History: Family History  Problem Relation Age of Onset  . Parkinsonism Mother   . Diabetes Mother   . Prostate cancer Father   . Heart disease Father   . Colon cancer Neg Hx   . Esophageal cancer Neg Hx   . Stomach cancer Neg Hx   . Rectal cancer Neg Hx     Social History: History   Social History  . Marital Status: Married    Spouse Name: N/A    Number of Children: 2  . Years of Education: N/A   Occupational History  .     Social History Main Topics  . Smoking status: Former Smoker -- 0.25 packs/day    Types: Cigarettes, Pipe    Quit date: 07/05/1991  . Smokeless tobacco: Never Used  Comment: cigs in college,  . Alcohol Use: 1.8 oz/week    3 Cans of beer per week     Comment: 2-4 beers week  . Drug Use: No  . Sexually Active: Not on file   Other Topics Concern  . Not on file   Social History Narrative  . No narrative on file    Electrocardiogram:  3/14  NSR normal ECG   Assessment and Plan

## 2012-11-25 NOTE — Assessment & Plan Note (Signed)
Well controlled.  Continue current medications and low sodium Dash type diet.    

## 2012-11-25 NOTE — Patient Instructions (Signed)
Your physician recommends that you schedule a follow-up appointment in:  AS NEEDED  Your physician has requested that you have cardiac CT. Cardiac computed tomography (CT) is a painless test that uses an x-ray machine to take clear, detailed pictures of your heart. For further information please visit [email or web address] Please follow instruction sheet as given.  Your physician recommends that you continue on your current medications as directed. Please refer to the Current Medication list given to you today.

## 2012-11-25 NOTE — Assessment & Plan Note (Signed)
Options including cath and cardiac CTA discussed. He is a good candidate for latter on beta blocker already.  Since symptoms not exertional and seem to be helped by GERD measures favor CTA rather than invasive cath.  Risks of cath including stroke bleeding and need for emergency surgery discussed.  Will try to perform one or other this week

## 2012-11-25 NOTE — Assessment & Plan Note (Signed)
Rx would depend on presence or absence of CAD  Will discuss after CTA or cath.  Diet Rx discussed  F/U Dr Chilton Si

## 2012-11-26 ENCOUNTER — Encounter: Payer: Self-pay | Admitting: *Deleted

## 2012-11-26 ENCOUNTER — Ambulatory Visit (HOSPITAL_COMMUNITY)
Admission: RE | Admit: 2012-11-26 | Discharge: 2012-11-26 | Disposition: A | Discharge status: Home / Self Care | Payer: Medicare Other | Source: Ambulatory Visit | Attending: Cardiovascular Disease | Admitting: Cardiovascular Disease

## 2012-11-26 ENCOUNTER — Telehealth: Payer: Self-pay | Admitting: *Deleted

## 2012-11-26 DIAGNOSIS — R9430 Abnormal result of cardiovascular function study, unspecified: Secondary | ICD-10-CM

## 2012-11-26 DIAGNOSIS — Z01810 Encounter for preprocedural cardiovascular examination: Secondary | ICD-10-CM

## 2012-11-26 DIAGNOSIS — R9439 Abnormal result of other cardiovascular function study: Secondary | ICD-10-CM | POA: Insufficient documentation

## 2012-11-26 DIAGNOSIS — R931 Abnormal findings on diagnostic imaging of heart and coronary circulation: Secondary | ICD-10-CM

## 2012-11-26 DIAGNOSIS — R079 Chest pain, unspecified: Secondary | ICD-10-CM

## 2012-11-26 DIAGNOSIS — I2510 Atherosclerotic heart disease of native coronary artery without angina pectoris: Secondary | ICD-10-CM | POA: Insufficient documentation

## 2012-11-26 MED ORDER — NITROGLYCERIN 0.4 MG SL SUBL
0.4000 mg | SUBLINGUAL_TABLET | SUBLINGUAL | Status: DC | PRN
  Administered 2012-11-26: 0.4 mg via SUBLINGUAL
  Filled 2012-11-26: qty 25

## 2012-11-26 MED ORDER — NITROGLYCERIN 0.4 MG SL SUBL
SUBLINGUAL_TABLET | SUBLINGUAL | Status: AC
  Filled 2012-11-26: qty 25

## 2012-11-26 MED ORDER — IOHEXOL 350 MG/ML SOLN
80.0000 mL | Freq: Once | INTRAVENOUS | Status: AC | PRN
  Administered 2012-11-26: 80 mL via INTRAVENOUS

## 2012-11-26 NOTE — Telephone Encounter (Signed)
SPOKE WITH PT NEEDS  LHC,  SCHEDULED  PROCEDURE WITH DR Excell Seltzer FOR   11-28-12 AT  10:30  PER DR [facility]  ABN  CARDIAC CT./CY

## 2012-11-27 ENCOUNTER — Other Ambulatory Visit: Payer: Self-pay | Admitting: *Deleted

## 2012-11-27 ENCOUNTER — Other Ambulatory Visit (INDEPENDENT_AMBULATORY_CARE_PROVIDER_SITE_OTHER): Payer: Medicare Other

## 2012-11-27 DIAGNOSIS — Z01818 Encounter for other preprocedural examination: Secondary | ICD-10-CM

## 2012-11-27 DIAGNOSIS — Z01810 Encounter for preprocedural cardiovascular examination: Secondary | ICD-10-CM

## 2012-11-27 DIAGNOSIS — R9439 Abnormal result of other cardiovascular function study: Secondary | ICD-10-CM

## 2012-11-27 DIAGNOSIS — R079 Chest pain, unspecified: Secondary | ICD-10-CM

## 2012-11-27 DIAGNOSIS — R931 Abnormal findings on diagnostic imaging of heart and coronary circulation: Secondary | ICD-10-CM

## 2012-11-27 DIAGNOSIS — R9389 Abnormal findings on diagnostic imaging of other specified body structures: Secondary | ICD-10-CM

## 2012-11-27 DIAGNOSIS — I2584 Coronary atherosclerosis due to calcified coronary lesion: Secondary | ICD-10-CM

## 2012-11-27 LAB — CBC WITH DIFFERENTIAL/PLATELET
Basophils Relative: 0.3 % (ref 0.0–3.0)
Eosinophils Absolute: 0.2 10*3/uL (ref 0.0–0.7)
Eosinophils Relative: 2.4 % (ref 0.0–5.0)
HCT: 43.5 % (ref 39.0–52.0)
Lymphs Abs: 2.1 10*3/uL (ref 0.7–4.0)
MCHC: 33.6 g/dL (ref 30.0–36.0)
MCV: 92 fl (ref 78.0–100.0)
Monocytes Absolute: 0.5 10*3/uL (ref 0.1–1.0)
Neutrophils Relative %: 59.4 % (ref 43.0–77.0)
Platelets: 164 10*3/uL (ref 150.0–400.0)

## 2012-11-27 LAB — BASIC METABOLIC PANEL
BUN: 22 mg/dL (ref 6–23)
CO2: 30 mEq/L (ref 19–32)
Calcium: 9.6 mg/dL (ref 8.4–10.5)
Chloride: 101 mEq/L (ref 96–112)
Creatinine, Ser: 1.5 mg/dL (ref 0.4–1.5)
GFR: 51.45 mL/min — ABNORMAL LOW (ref >60.00)
Glucose, Bld: 147 mg/dL — ABNORMAL HIGH (ref 70–99)
Potassium: 3.3 mEq/L — ABNORMAL LOW (ref 3.5–5.1)
Sodium: 139 mEq/L (ref 135–145)

## 2012-11-27 LAB — PROTIME-INR: Prothrombin Time: 11.2 s (ref 10.2–12.4)

## 2012-11-28 ENCOUNTER — Inpatient Hospital Stay (HOSPITAL_BASED_OUTPATIENT_CLINIC_OR_DEPARTMENT_OTHER)
Admission: RE | Admit: 2012-11-28 | Discharge: 2012-11-28 | Disposition: A | Discharge status: Home / Self Care | Payer: Medicare Other | Source: Ambulatory Visit | Attending: Cardiovascular Disease | Admitting: Cardiovascular Disease

## 2012-11-28 ENCOUNTER — Other Ambulatory Visit: Payer: Self-pay | Admitting: Cardiovascular Disease

## 2012-11-28 ENCOUNTER — Encounter (HOSPITAL_BASED_OUTPATIENT_CLINIC_OR_DEPARTMENT_OTHER)
Admission: RE | Disposition: A | Discharge status: Home / Self Care | Payer: Self-pay | Source: Ambulatory Visit | Attending: Cardiovascular Disease

## 2012-11-28 DIAGNOSIS — I2510 Atherosclerotic heart disease of native coronary artery without angina pectoris: Secondary | ICD-10-CM | POA: Insufficient documentation

## 2012-11-28 DIAGNOSIS — Z79899 Other long term (current) drug therapy: Secondary | ICD-10-CM | POA: Insufficient documentation

## 2012-11-28 DIAGNOSIS — R9439 Abnormal result of other cardiovascular function study: Secondary | ICD-10-CM | POA: Insufficient documentation

## 2012-11-28 DIAGNOSIS — R079 Chest pain, unspecified: Secondary | ICD-10-CM | POA: Insufficient documentation

## 2012-11-28 SURGERY — JV LEFT HEART CATHETERIZATION WITH CORONARY ANGIOGRAM
Anesthesia: Moderate Sedation

## 2012-11-28 MED ORDER — SODIUM CHLORIDE 0.9 % IV SOLN
INTRAVENOUS | Status: DC
  Administered 2012-11-28: 10:00:00 via INTRAVENOUS

## 2012-11-28 MED ORDER — SODIUM CHLORIDE 0.9 % IV SOLN: 1.0000 mL/kg/h | INTRAVENOUS | Status: DC

## 2012-11-28 MED ORDER — ONDANSETRON HCL 4 MG/2ML IJ SOLN: 4.0000 mg | Freq: Four times a day (QID) | INTRAMUSCULAR | Status: DC | PRN

## 2012-11-28 MED ORDER — ACETAMINOPHEN 325 MG PO TABS: 650.0000 mg | ORAL_TABLET | ORAL | Status: DC | PRN

## 2012-11-28 NOTE — H&P (View-Only) (Signed)
Patient ID: Harry Leach, male   DOB: 12/31/1945, 67 y.o.   MRN: [id number] 67 yo no history of CAD  Elevated lipids and HTN.  Has had chest pressure for 2 years.  Can radiate to left arm and shoulder Relates this to GI symptoms and gas.  Has been helped by omeprazole.  Can do yard work and tennis with no pressure.  Pressure not related to activity.  Reviewed ETT done by Dr Ed Green 08/10/12  Exercised 9 minutes on Bruce protocol.  Max HR 150 no chest pain 1.5 mm ST segment depression in V 56  ROS: Denies fever, malais, weight loss, blurry vision, decreased visual acuity, cough, sputum, SOB, hemoptysis, pleuritic pain, palpitaitons, heartburn, abdominal pain, melena, lower extremity edema, claudication, or rash.  All other systems reviewed and negative   General: Affect appropriate Healthy:  appears stated age HEENT: normal Neck supple with no adenopathy JVP normal no bruits no thyromegaly Lungs clear with no wheezing and good diaphragmatic motion Heart:  S1/S2 no murmur,rub, gallop or click PMI normal Abdomen: benighn, BS positve, no tenderness, no AAA no bruit.  No HSM or HJR Distal pulses intact with no bruits No edema Neuro non-focal Skin warm and dry No muscular weakness  Medications Current Outpatient Prescriptions  Medication Sig Dispense Refill  . aspirin 81 MG tablet Take 81 mg by mouth daily.      . atenolol-chlorthalidone (TENORETIC) 100-25 MG per tablet Take 1 tablet by mouth daily before breakfast.      . azelastine (ASTELIN) 137 MCG/SPRAY nasal spray 1-2 puffs each nostril once or twice daily when needed  30 mL  prn  . finasteride (PROSCAR) 5 MG tablet Take 5 mg by mouth daily.      . fluconazole (DIFLUCAN) 150 MG tablet Take 1 tablet (150 mg total) by mouth once. Daily x 3 days  3 tablet  0  . fluticasone (FLONASE) 50 MCG/ACT nasal spray 1-2 puffs each nostril once daily at bedtime  16 g  prn  . IBUPROFEN PO Take by mouth as needed.      . Lactase (LACTOSE FAST  ACTING RELIEF PO) Take 1 tablet by mouth 2 (two) times daily as needed. take with dairy products       . Multiple Vitamin (MULTIVITAMIN) tablet Take 1 tablet by mouth daily.      . omeprazole (PRILOSEC) 20 MG capsule Take 20 mg by mouth daily.      . Tamsulosin HCl (FLOMAX) 0.4 MG CAPS Take 0.4 mg by mouth 2 (two) times daily.       No current facility-administered medications for this visit.    Allergies Review of patient's allergies indicates no known allergies.  Family History: Family History  Problem Relation Age of Onset  . Parkinsonism Mother   . Diabetes Mother   . Prostate cancer Father   . Heart disease Father   . Colon cancer Neg Hx   . Esophageal cancer Neg Hx   . Stomach cancer Neg Hx   . Rectal cancer Neg Hx     Social History: History   Social History  . Marital Status: Married    Spouse Name: N/A    Number of Children: 2  . Years of Education: N/A   Occupational History  .     Social History Main Topics  . Smoking status: Former Smoker -- 0.25 packs/day    Types: Cigarettes, Pipe    Quit date: 07/05/1991  . Smokeless tobacco: Never Used       Comment: cigs in college,  . Alcohol Use: 1.8 oz/week    3 Cans of beer per week     Comment: 2-4 beers week  . Drug Use: No  . Sexually Active: Not on file   Other Topics Concern  . Not on file   Social History Narrative  . No narrative on file    Electrocardiogram:  3/14  NSR normal ECG   Assessment and Plan  

## 2012-11-28 NOTE — CV Procedure (Signed)
   Cardiac Catheterization Procedure Note  Name: Harry Leach MRN: [id number] DOB: 12-15-1945  Procedure: Left Heart Cath, Selective Coronary Angiography, LV angiography  Indication: Chest pain, abnormal treadmill and coronary CTA   Procedural Details: The right wrist was prepped, draped, and anesthetized with 1% lidocaine. Using the modified Seldinger technique, a 5 French sheath was introduced into the right radial artery. 3 mg of verapamil was administered through the sheath, weight-based unfractionated heparin was administered intravenously. Standard Judkins catheters were used for selective coronary angiography and left ventriculography. Catheter exchanges were performed over an exchange length guidewire. There were no immediate procedural complications. A TR band was used for radial hemostasis at the completion of the procedure.  The patient was transferred to the post catheterization recovery area for further monitoring.  Procedural Findings: Hemodynamics: AO 134/66 mean 94 LV 127/25  Coronary angiography: Coronary dominance: right  Left mainstem: The left mainstem is calcified. The proximal and mid vessel are widely patent. There is mild tapering at the distal left main with estimated 40% distal left main stenosis as it divides into the LAD and left circumflex.  Left anterior descending (LAD): The LAD is moderately to heavily calcified. There are 2 small caliber diagonal branches. The first diagonal has 50% ostial stenosis. This vessel is very small in caliber, estimated at 1.5 mm or less. The second diagonal is small with no significant stenosis. The mid LAD between the diagonal branches has diffuse 50% calcified stenosis. The distal LAD is patent and it wraps around the left ventricular apex.  Left circumflex (LCx): The left circumflex has mild ostial stenosis of 30-40%. The vessel supplies a small first OM and a moderate caliber second OM branch without significant  stenoses.  Right coronary artery (RCA): The right coronary artery is large and dominant. There is 30-50% proximal vessel stenosis. There is diffuse calcification through the proximal, mid, and distal vessel. The distal vessel has diffuse irregularity. The PDA branch is patent without significant stenosis. The posterior AV segment has 50% ostial stenosis with no other significant disease noted.  Left ventriculography: Left ventricular systolic function is vigorous, LVEF is estimated at 65-70 %, there is no significant mitral regurgitation   Final Conclusions:   1. Mild to moderate diffuse, calcified three-vessel coronary disease with anatomy as detailed above 2. Vigorous left ventricular function  Recommendations: The patient does not have high-grade stenosis based on angiography. I would recommend aggressive risk reduction and medical management.  Tonny Bollman 11/28/2012, 11:18 AM

## 2012-11-28 NOTE — Interval H&P Note (Signed)
History and Physical Interval Note:  11/28/2012 10:51 AM  Harry Leach  has presented today for surgery, with the diagnosis of + Cardiac CT  The various methods of treatment have been discussed with the patient and family. After consideration of risks, benefits and other options for treatment, the patient has consented to  Procedure(s): JV LEFT HEART CATHETERIZATION WITH CORONARY ANGIOGRAM (N/A) as a surgical intervention .  The patient's history has been reviewed, patient examined, no change in status, stable for surgery.  I have reviewed the patient's chart and labs.  Questions were answered to the patient's satisfaction.    Cath Lab Visit (complete for each Cath Lab visit)  Clinical Evaluation Leading to the Procedure:   ACS: no  Non-ACS:    Anginal Classification: CCS II  Anti-ischemic medical therapy: Minimal Therapy (1 class of medications)  Non-Invasive Test Results: Intermediate-risk stress test findings: cardiac mortality 1-3%/year  Prior CABG: No previous CABG         Tonny Bollman

## 2012-11-28 NOTE — Progress Notes (Signed)
Small hematoma visible at right radial site, TR band removed and manual pressure held by Theodoro Grist for 20 minutes.  Tegaderm dressing applied to site.

## 2012-11-28 NOTE — Progress Notes (Signed)
Allen's test performed on right hand with positive results, spo2 96%.

## 2012-11-30 NOTE — Assessment & Plan Note (Signed)
Good compliance and control at current settings. He likes his new machine.

## 2012-11-30 NOTE — Assessment & Plan Note (Signed)
Throat irritation from postnasal drip and possible thrush. Plan-stopped Flonase for a while to reduce steroid. Given prescription for Diflucan

## 2012-12-02 ENCOUNTER — Other Ambulatory Visit: Payer: Self-pay | Admitting: *Deleted

## 2012-12-02 MED ORDER — ATORVASTATIN CALCIUM 20 MG PO TABS: 20.0000 mg | ORAL_TABLET | Freq: Every day | ORAL | Status: DC

## 2012-12-02 NOTE — Progress Notes (Signed)
DR Eden Emms SPOKE WITH  PT  PER PT HAS STOPPED DIFLUCAN   PT TO START   ATORVASTATIN 20 MG  IN  1 WEEK  SCRIPT SENT  TO PHARMACY  TO HAVE F/U LABS DONE WITH DR GREEN .Zack Seal

## 2012-12-17 ENCOUNTER — Ambulatory Visit (INDEPENDENT_AMBULATORY_CARE_PROVIDER_SITE_OTHER): Payer: Medicare Other | Admitting: Physician Assistant

## 2012-12-17 ENCOUNTER — Encounter: Payer: Self-pay | Admitting: Physician Assistant

## 2012-12-17 VITALS — BP 130/82 | HR 60 | Ht 67.0 in | Wt 206.0 lb

## 2012-12-17 DIAGNOSIS — E876 Hypokalemia: Secondary | ICD-10-CM

## 2012-12-17 DIAGNOSIS — I10 Essential (primary) hypertension: Secondary | ICD-10-CM

## 2012-12-17 DIAGNOSIS — I2510 Atherosclerotic heart disease of native coronary artery without angina pectoris: Secondary | ICD-10-CM

## 2012-12-17 DIAGNOSIS — E785 Hyperlipidemia, unspecified: Secondary | ICD-10-CM

## 2012-12-17 LAB — BASIC METABOLIC PANEL
CO2: 29 mEq/L (ref 19–32)
Chloride: 101 mEq/L (ref 96–112)
Creatinine, Ser: 1.7 mg/dL — ABNORMAL HIGH (ref 0.4–1.5)
Potassium: 3.3 mEq/L — ABNORMAL LOW (ref 3.5–5.1)

## 2012-12-17 MED ORDER — NITROGLYCERIN 0.4 MG SL SUBL: 0.4000 mg | SUBLINGUAL_TABLET | SUBLINGUAL | Status: DC | PRN

## 2012-12-17 NOTE — Progress Notes (Signed)
[address] [place], [place]  [address] Phone: [phone] Fax:  [phone]  Date:  12/17/2012   ID:  Harry Leach, DOB 10/05/1945, MRN [id number]  PCP:  Enrique Sack, MD  Cardiologist:  Dr. Charlton Haws     History of Present Illness: Harry Leach is a 67 y.o. male who returns for follow up after recent cardiac catheterization.  He was recently evaluated by Dr. Eden Emms 11/2012. He has a history of HTN and HL. He had a POET performed by his PCP that demonstrated 1.5 mm of ST segment depression in V5-V6. Cardiac CTA was recommended. This demonstrated a calcium score 684 (97th percentile for age and sex matched controls), possible > 50% calcific stenosis in the mid LAD and distal RCA.  Cardiac catheterization was arranged. LHC 11/28/12:  dLM 40%, oLAD 50%, mLAD 50%, oCFX 30-40%, pRCA 30-50%, posterior AV segment ostial 50%, EF 60-70% (vigorous LV function). Aggressive medical therapy was recommended.  The patient denies chest pain, shortness of breath, syncope, orthopnea, PND or significant pedal edema.   Labs (2/13):  K 3, creatinine 1.46, ALT 23 Labs (7/14):  K 3.3, creatinine 1.5, Hgb 14.6  Wt Readings from Last 3 Encounters:  11/28/12 204 lb (92.534 kg)  11/28/12 204 lb (92.534 kg)  11/25/12 204 lb (92.534 kg)     Past Medical History  Diagnosis Date  . GERD (gastroesophageal reflux disease)   . Arthritis   . Hypertension     stress test 1999/ clearance Dr Chilton Si on chart   EKG 6/12 on chart  . Sleep apnea     last study  2008- MODERATE  Dr Maple Hudson sleep study EPIC, wears CPAP  . Hyperlipidemia   . Coronary artery disease     a. Cardiac CTA 7/14: Ca score 684 (97th percentile for age and sex matched controls), possible > 50% calcific stenosis in the mid LAD and distal RCA. =>  LHC 11/28/12:  dLM 40%, oLAD 50%, mLAD 50%, oCFX 30-40%, pRCA 30-50%, posterior AV segment ostial 50%, EF 60-70% (vigorous LV function). Aggressive medical therapy was  recommended.    Current Outpatient Prescriptions  Medication Sig Dispense Refill  . aspirin 81 MG tablet Take 81 mg by mouth daily.      Marland Kitchen atenolol-chlorthalidone (TENORETIC) 100-25 MG per tablet Take 1 tablet by mouth daily before breakfast.      . atorvastatin (LIPITOR) 20 MG tablet Take 1 tablet (20 mg total) by mouth daily.  30 tablet  11  . azelastine (ASTELIN) 137 MCG/SPRAY nasal spray 1-2 puffs each nostril once or twice daily when needed  30 mL  prn  . finasteride (PROSCAR) 5 MG tablet Take 5 mg by mouth daily.      . fluconazole (DIFLUCAN) 150 MG tablet Take 1 tablet (150 mg total) by mouth once. Daily x 3 days  3 tablet  0  . fluticasone (FLONASE) 50 MCG/ACT nasal spray 1-2 puffs each nostril once daily at bedtime  16 g  prn  . IBUPROFEN PO Take by mouth as needed.      . Lactase (LACTOSE FAST ACTING RELIEF PO) Take 1 tablet by mouth 2 (two) times daily as needed. take with dairy products       . Multiple Vitamin (MULTIVITAMIN) tablet Take 1 tablet by mouth daily.      Marland Kitchen omeprazole (PRILOSEC) 20 MG capsule Take 20 mg by mouth daily.      . Tamsulosin HCl (FLOMAX) 0.4 MG CAPS Take  0.4 mg by mouth 2 (two) times daily.       No current facility-administered medications for this visit.    Allergies:   No Known Allergies  Social History:  The patient  reports that he quit smoking about 21 years ago. His smoking use included Cigarettes and Pipe. He smoked 0.25 packs per day. He has never used smokeless tobacco. He reports that he drinks about 1.8 ounces of alcohol per week. He reports that he does not use illicit drugs.   ROS:  Please see the history of present illness.      All other systems reviewed and negative.   PHYSICAL EXAM: VS:  BP 130/82  Pulse 60  Ht 5\' 7"  (1.702 m)  Wt 206 lb (93.441 kg)  BMI 32.26 kg/m2 Well nourished, well developed, in no acute distress HEENT: normal Neck: no JVD Cardiac:  normal S1, S2; RRR; no murmur Lungs:  clear to auscultation bilaterally,  no wheezing, rhonchi or rales Abd: soft, nontender, no hepatomegaly Ext: no edema; right wrist without hematoma or mass  Skin: warm and dry Neuro:  CNs 2-12 intact, no focal abnormalities noted   ASSESSMENT AND PLAN:  1. CAD:  Non-obstructive by LHC.  Continue risk factor modification.  Continue ASA, statin, beta blocker.  He has NTG to use prn.  He does not smoke.  He will continue to increase activity slowly.  2. Hypertension:  Controlled.  Continue current therapy.  3. Hypokalemia:  Check BMET today.  4. Hyperlipidemia:  Check Lipids and LFTs in 6 weeks.   5. Disposition:  F/u with Dr. Charlton Haws in 6 mos.   Signed, Tereso Newcomer, PA-C  12/17/2012 2:07 PM

## 2012-12-17 NOTE — Patient Instructions (Addendum)
Labs today:BMET  Your physician recommends that you return for a FASTING lipid profile and LFT on WED 02/17/13  Your physician recommends that you schedule a follow-up appointment in: 6 months with Dr.Nishan

## 2012-12-18 ENCOUNTER — Other Ambulatory Visit: Payer: Self-pay

## 2012-12-19 ENCOUNTER — Other Ambulatory Visit: Payer: Self-pay | Admitting: Nurse Practitioner

## 2012-12-19 DIAGNOSIS — I10 Essential (primary) hypertension: Secondary | ICD-10-CM

## 2012-12-19 MED ORDER — ATENOLOL 100 MG PO TABS: 100.0000 mg | ORAL_TABLET | Freq: Every day | ORAL | Status: DC

## 2012-12-31 ENCOUNTER — Ambulatory Visit (INDEPENDENT_AMBULATORY_CARE_PROVIDER_SITE_OTHER): Payer: Medicare Other

## 2012-12-31 VITALS — BP 130/82 | HR 50 | Ht 67.0 in | Wt 208.0 lb

## 2012-12-31 DIAGNOSIS — I10 Essential (primary) hypertension: Secondary | ICD-10-CM

## 2012-12-31 LAB — BASIC METABOLIC PANEL
BUN: 19 mg/dL (ref 6–23)
CO2: 30 mEq/L (ref 19–32)
Chloride: 105 mEq/L (ref 96–112)
Glucose, Bld: 107 mg/dL — ABNORMAL HIGH (ref 70–99)
Potassium: 4 mEq/L (ref 3.5–5.1)
Sodium: 139 mEq/L (ref 135–145)

## 2012-12-31 NOTE — Progress Notes (Signed)
Patient came to office for B/P check and BMET.

## 2013-01-27 ENCOUNTER — Institutional Professional Consult (permissible substitution): Payer: Medicare Other | Admitting: Cardiovascular Disease

## 2013-02-19 ENCOUNTER — Other Ambulatory Visit (INDEPENDENT_AMBULATORY_CARE_PROVIDER_SITE_OTHER): Payer: Medicare Other

## 2013-02-19 DIAGNOSIS — I10 Essential (primary) hypertension: Secondary | ICD-10-CM

## 2013-02-19 DIAGNOSIS — E876 Hypokalemia: Secondary | ICD-10-CM

## 2013-02-19 DIAGNOSIS — I2510 Atherosclerotic heart disease of native coronary artery without angina pectoris: Secondary | ICD-10-CM

## 2013-02-19 LAB — BASIC METABOLIC PANEL
BUN: 19 mg/dL (ref 6–23)
Chloride: 105 mEq/L (ref 96–112)
GFR: 56.28 mL/min — ABNORMAL LOW (ref >60.00)
Glucose, Bld: 143 mg/dL — ABNORMAL HIGH (ref 70–99)
Potassium: 4.2 mEq/L (ref 3.5–5.1)
Sodium: 142 mEq/L (ref 135–145)

## 2013-02-19 LAB — HEPATIC FUNCTION PANEL
ALT: 24 U/L (ref 0–53)
AST: 30 U/L (ref 0–37)
Albumin: 4.3 g/dL (ref 3.5–5.2)
Alkaline Phosphatase: 67 U/L (ref 39–117)
Total Protein: 6.9 g/dL (ref 6.0–8.3)

## 2013-02-19 LAB — LIPID PANEL: Cholesterol: 126 mg/dL (ref 0–200)

## 2013-02-20 ENCOUNTER — Encounter: Payer: Self-pay | Admitting: *Deleted

## 2013-03-14 ENCOUNTER — Ambulatory Visit (INDEPENDENT_AMBULATORY_CARE_PROVIDER_SITE_OTHER): Payer: Medicare Other | Admitting: Surgery

## 2013-03-20 ENCOUNTER — Other Ambulatory Visit: Payer: Self-pay

## 2013-09-21 IMAGING — CT CT HEART MORP W/ CTA COR W/ SCORE W/ CA W/CM &/OR W/O CM
1 of 11 series · 3 of 20 positions shown, 4 images · IV contrast (CONTRAST)
Comparison: No priors.

***ADDENDUM*** CREATED: 11/29/2012 [DATE]

OVER-READ INTERPRETATION - CT CHEST
The following report is an over-read performed by radiologist Dr.
[DATE].  This over-read does not include interpretation of
cardiac or coronary anatomy or pathology.  The coronary calcium
score interpretation by the cardiologist is attached.
INDICATION: Abnormal ETT
PROTOCOL: The patient was scanned on a Philips 256 scanner. Gantry
rotation speed was 270 msec.  No beta blocker was needed Average HR
during scan was 56 bpm SL nitro was given.  A [id number] prospective
scan with idose 3 was triggered in the descending thoracic aorta at
111 HU's.  80cc of contrast was used.  The 3D data set was analyzed
using MIP, VRT and MPR modes on a dedicated [employer] and [employer]
work station

[Series 11: w/o ec, 75.0% · axial · non-contrast · 0.44mm/px · z∈[-220,-81]mm · 3 of 350 slices shown, 4 images]
[im 1/350  vessel]
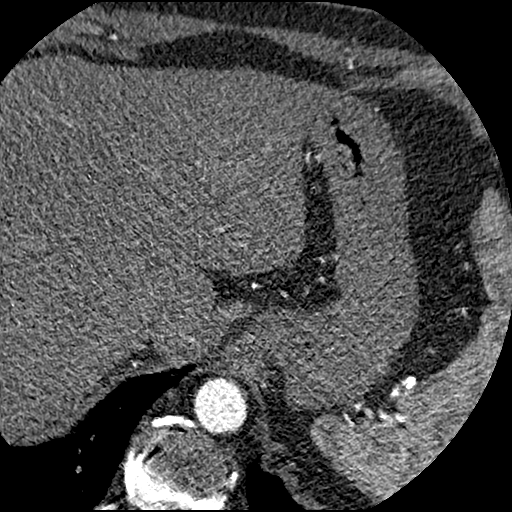
[im 1/350  lung]
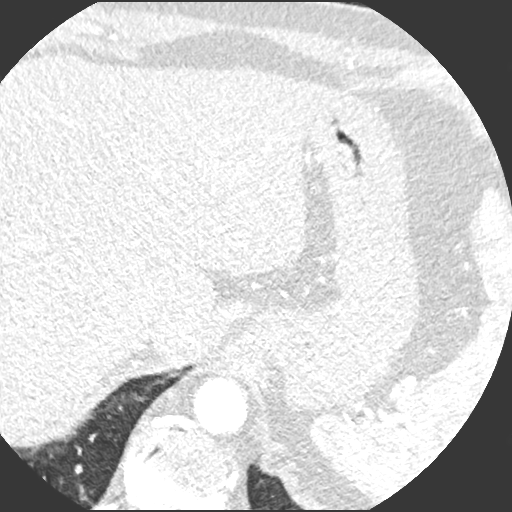
[im 175/350  vessel]
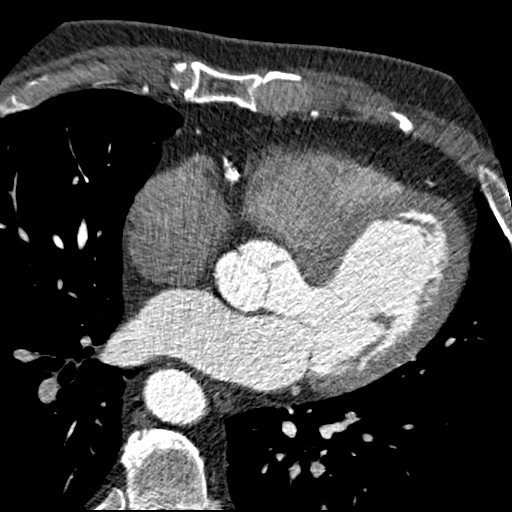
[im 350/350  vessel]
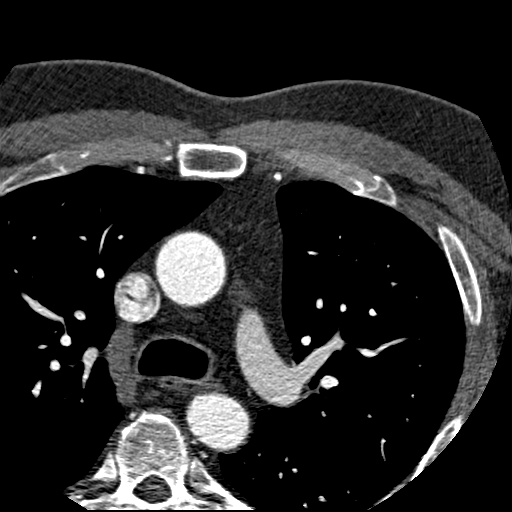

[3 of 20 positions shown; findings below may reference images not displayed]

FINDINGS: There is a small amount of subsegmental atelectasis or
scarring in the lower lobes of the lungs bilaterally which is very
mild.  Within the visualized portions of the thorax there is no
acute consolidative airspace disease, no pleural effusions, no
suspicious-appearing pulmonary nodules or masses and no
pneumothorax.  Visualized portions of the upper abdomen are
unremarkable. There are no aggressive appearing lytic or blastic
lesions noted in the visualized portions of the skeleton.
IMPRESSION: 1.  No significant incidental noncardiac findings.

***END ADDENDUM*** SIGNED BY: Isabel Cristina Grosbelli, M.D.
Cardiac CT:
FINDINGS: Calcium Score: 684 involving distal LM and all 3 arteries. Most
dense area in the proximal and mid LAD

Coronary arteries: Right dominant with no anomalies

LM Normal

LAD- 50% or less diffuse calcific disease proximally extending to
LM.  50% or greater stenosis in the mid vessel

      D1: no significant disease

      D2: no significant disease

Circumflex:  Less than 50% calcific disease in proximal and mid
vessel

      OM1: Less than 30% calcific disease

RCA:  Calcified throughout vessel.  50% or less mid vessel
stenosis.  50% or greater distal RCA disease

      PDA: Less than 30% stenosis
IMPRESSION: 1)    Calcium Score 684 97th percentile for age and sex matched
      controls

2)    Possible greater than 50% calcific stenosis in the mid LAD
and distal RCA
Patient will be referred for heart cath in light of these findings
and abnormal ETT

## 2013-11-03 ENCOUNTER — Encounter: Payer: Self-pay | Admitting: Physician Assistant

## 2013-11-25 ENCOUNTER — Other Ambulatory Visit: Payer: Self-pay | Admitting: Cardiovascular Disease

## 2013-12-10 ENCOUNTER — Encounter: Payer: Self-pay | Admitting: Physician Assistant

## 2013-12-10 ENCOUNTER — Ambulatory Visit (INDEPENDENT_AMBULATORY_CARE_PROVIDER_SITE_OTHER): Payer: Medicare Other | Admitting: Physician Assistant

## 2013-12-10 VITALS — BP 132/82 | HR 44 | Ht 67.0 in | Wt 210.8 lb

## 2013-12-10 DIAGNOSIS — R001 Bradycardia, unspecified: Secondary | ICD-10-CM

## 2013-12-10 DIAGNOSIS — I10 Essential (primary) hypertension: Secondary | ICD-10-CM

## 2013-12-10 DIAGNOSIS — I498 Other specified cardiac arrhythmias: Secondary | ICD-10-CM

## 2013-12-10 DIAGNOSIS — E785 Hyperlipidemia, unspecified: Secondary | ICD-10-CM

## 2013-12-10 DIAGNOSIS — I2510 Atherosclerotic heart disease of native coronary artery without angina pectoris: Secondary | ICD-10-CM

## 2013-12-10 MED ORDER — ATORVASTATIN CALCIUM 20 MG PO TABS
20.0000 mg | ORAL_TABLET | Freq: Every day | ORAL | Status: AC
Start: 2013-12-10 — End: ?

## 2013-12-10 NOTE — Patient Instructions (Signed)
Schedule follow up with Dr. Jenkins Rouge or Richardson Dopp, PA-C in 1 year.

## 2013-12-10 NOTE — Progress Notes (Signed)
Cardiology Office Note    Date:  12/10/2013   ID:  Harry Leach, DOB 1945-05-27, MRN [id number]  PCP:  Criselda Peaches, MD  Cardiologist:  Dr. Jenkins Rouge       History of Present Illness: Harry Leach is a 68 y.o. male with a hx of CAD, HTN, HL.  ETT last year demonstrated lateral ST depression.  Cardiac CTA was abnormal with Ca score of 684 and possible > 50% stenosis in mid LAD and dist RCA.  LHC demonstrated diffuse mod non-obstructive CAD.  Med Rx was recommended.  Last seen 12/2012.   Returns for follow up.  PCP recently cut back on Atenolol due to bradycardia.  Patient denies fatigue, dizziness, syncope, near syncope.  No chest pain, dyspnea, PND, orthopnea, edema.     Recent Labs: 02/19/2013: ALT 24; Creatinine 1.4; HDL Cholesterol by NMR 42.90; LDL (calc) 68; Potassium 4.2 11/03/13: Hemoglobin 14.9, potassium 4.2, creatinine 1.29, cholesterol 119, triglycerides 83, HDL 38, LDL 64, ALT 25  Wt Readings from Last 3 Encounters:  12/10/13 210 lb 12.8 oz (95.618 kg)  12/31/12 208 lb (94.348 kg)  12/17/12 206 lb (93.441 kg)     Past Medical History  Diagnosis Date  . GERD (gastroesophageal reflux disease)   . Arthritis   . Hypertension     stress test 1999/ clearance Dr Nyoka Cowden on chart   EKG 6/12 on chart  . Sleep apnea     last study  2008- MODERATE  Dr Annamaria Boots sleep study EPIC, wears CPAP  . Hyperlipidemia   . Coronary artery disease     a. Cardiac CTA 7/14: Ca score 684 (97th percentile for age and sex matched controls), possible > 50% calcific stenosis in the mid LAD and distal RCA. =>  LHC 11/28/12:  dLM 40%, oLAD 50%, mLAD 50%, oCFX 30-40%, pRCA 30-50%, posterior AV segment ostial 50%, EF 60-70% (vigorous LV function). Aggressive medical therapy was recommended.    Current Outpatient Prescriptions  Medication Sig Dispense Refill  . aspirin 81 MG tablet Take 81 mg by mouth daily.      Marland Kitchen atenolol (TENORMIN) 100 MG tablet Take 50 mg by mouth daily.      Marland Kitchen  atorvastatin (LIPITOR) 20 MG tablet TAKE 1 TABLET ONCE DAILY.  30 tablet  0  . azelastine (ASTELIN) 137 MCG/SPRAY nasal spray 1-2 puffs each nostril once or twice daily when needed  30 mL  prn  . finasteride (PROSCAR) 5 MG tablet Take 5 mg by mouth daily.      . fluticasone (FLONASE) 50 MCG/ACT nasal spray 1-2 puffs each nostril once daily at bedtime  16 g  prn  . IBUPROFEN PO Take by mouth as needed.      . Lactase (LACTOSE FAST ACTING RELIEF PO) Take 1 tablet by mouth 2 (two) times daily as needed. take with dairy products       . Multiple Vitamin (MULTIVITAMIN) tablet Take 1 tablet by mouth daily.      . nitroGLYCERIN (NITROSTAT) 0.4 MG SL tablet Place 1 tablet (0.4 mg total) under the tongue every 5 (five) minutes as needed for chest pain.      Marland Kitchen omeprazole (PRILOSEC) 20 MG capsule Take 20 mg by mouth daily.      . Tamsulosin HCl (FLOMAX) 0.4 MG CAPS Take 0.4 mg by mouth 2 (two) times daily.       No current facility-administered medications for this visit.    Allergies:   Review of patient's  allergies indicates no known allergies.   Social History:  The patient  reports that he quit smoking about 22 years ago. His smoking use included Cigarettes and Pipe. He smoked 0.25 packs per day. He has never used smokeless tobacco. He reports that he drinks about 1.8 ounces of alcohol per week. He reports that he does not use illicit drugs.   Family History:  The patient's family history includes Cancer in his father; Diabetes in his mother; Heart attack in his father; Heart disease in his father; Heart failure in his father; Hypertension in his father and mother; Parkinsonism in his mother; Prostate cancer in his father. There is no history of Colon cancer, Esophageal cancer, Stomach cancer, or Rectal cancer.   ROS:  Please see the history of present illness.      All other systems reviewed and negative.   PHYSICAL EXAM: VS:  BP 132/82  Pulse 44  Ht 5\' 7"  (1.702 m)  Wt 210 lb 12.8 oz (95.618  kg)  BMI 33.01 kg/m2 Well nourished, well developed, in no acute distress HEENT: normal Neck: no JVD Cardiac:  normal S1, S2; RRR; no murmur Lungs:  clear to auscultation bilaterally, no wheezing, rhonchi or rales Abd: soft, nontender, no hepatomegaly Ext: no edema Skin: warm and dry Neuro:  CNs 2-12 intact, no focal abnormalities noted  EKG:  Sinus brady, HR 44, no ST changes     ASSESSMENT AND PLAN:  Atherosclerosis of native coronary artery of native heart without angina pectoris:  No angina. Continue aspirin, statin.  Essential hypertension:  Controlled.  HLD (hyperlipidemia):  Management by PCP. Continue statin.  Bradycardia:  Asymptomatic. At this point, I believe it is okay for him to remain on his current dose of beta blocker. We could consider changing his blood pressure medication to amlodipine or an ACE inhibitor if he develops symptoms   Disposition:  F/u with Dr. Jenkins Rouge or me in 1 year.    Signed, Versie Starks, MHS 12/10/2013 3:10 PM    [facility], [place], [place]  [address] Phone: [phone]; Fax: [phone]

## 2013-12-31 ENCOUNTER — Encounter: Payer: Self-pay | Admitting: Internal Medicine

## 2013-12-31 ENCOUNTER — Ambulatory Visit: Payer: Medicare Other | Admitting: Internal Medicine

## 2013-12-31 VITALS — BP 130/74 | HR 52 | Ht 65.75 in | Wt 210.0 lb

## 2013-12-31 DIAGNOSIS — G4733 Obstructive sleep apnea (adult) (pediatric): Secondary | ICD-10-CM

## 2013-12-31 NOTE — Patient Instructions (Signed)
  We can continue CPAP 11/   Ask Dr Nyoka Cowden about the coated tongue, which might be related to your frequent throat clearing. Consider stopping the flonase nasal spray fro a week or 2, to see if it clears up.   Please call if we can help

## 2013-12-31 NOTE — Progress Notes (Signed)
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 Last here in 2010. PCP Dr Zada Girt. NPSG 07/15/06- AHI 36.7/ hr He has been using CPAP 11/Advanced. Having more problems sleeping. Pulses then turns, flipping side to side. Knee pain and allergic nasal congestion as well as bathroom waking repeatedly at night. Or trouble this year with seasonal allergic rhinitis treated with Flonase and Astelin nasal sprays. He has to take the mask off or delay putting it on if his nose is bothering him. Throat clearing bothers him. He is on omeprazole and does not feel reflux events. Usual bedtime between midnight and 1 AM. Sleep latency 10 minutes. Wakes between 3 and 6 times per night before up to 8 AM. 5 or 10 pound weight gain in the last 2 years.  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 Last here in 2010 . PCP Dr Zada Girt. Wears CPAP every night for approximately 5 hours and pressure working well. CPAP 11/[facility]. Machine is at least 65 years old. We discussed timing of replacement and suggested he replace it for wears out so that he has a functioning spare machine.  history of allergic rhinitis. He is satisfied to continue with Astelin and Flonase nasal sprays. He saw no advantage to Dymista.  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 Hx allergic rhinitis Has a new cpap 11/ Advanced and it's working well, wearing every night, has petite nose mask.  Being evaluated for ?angina. Discussed OSA and cardiac disease. Dry irritation back of throat.  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 Hx allergic rhinitis FOLLOWS FOR: wearing cpap 11/ Advanced about 7 hours nightly.  Pt states his mask irritates his mustache, no other complaints with mask/supplies.     ROS-see HPI Constitutional:   No-   weight loss, night sweats,  fevers, chills, fatigue, lassitude. HEENT:   No-  headaches, difficulty swallowing, tooth/dental problems, sore throat,       No-  sneezing, itching, ear ache, +occasional nasal congestion, +post nasal drip,  CV:  No-   chest pain, orthopnea, PND, swelling in lower extremities, anasarca, dizziness, palpitations Resp: No-   shortness of breath with exertion or at rest.              No-   productive cough,  No non-productive cough,  No- coughing up of blood.              No-   change in color of mucus.  No- wheezing.   Skin: No-   rash or lesions. GI:  No-   heartburn, indigestion, abdominal pain, nausea, vomiting,  GU:  MS:  No-   joint pain or swelling.  . Neuro-     nothing unusual Psych:  No- change in mood or affect. No depression or anxiety.  No memory loss.  OBJ- Physical Exam General- Alert, Oriented, Affect-appropriate, Distress- none acute Skin- rash-none, lesions- none, excoriation- none Lymphadenopathy- none Head- atraumatic            Eyes- Gross vision intact, PERRLA, conjunctivae and secretions clear            Ears- Hearing, canals-normal            Nose- Clear, no-Septal dev, mucus, polyps, erosion, perforation             Throat- Mallampati IV ,  mucosa? thrush , drainage- none, tonsils- atrophic. Throat clearing again noted. Neck- flexible , trachea midline, no stridor , thyroid nl, carotid no bruit Chest - symmetrical excursion , unlabored           Heart/CV- RRR , no murmur , no gallop  , no rub, nl s1 s2                           - JVD- none , edema- none, stasis changes- none, varices- none           Lung- clear to P&A, wheeze- none, cough- none , dullness-none, rub- none           Chest wall-  Abd-  Br/ Gen/ Rectal- Not done, not indicated Extrem- cyanosis- none, clubbing, none, atrophy- none, strength- nl Neuro- grossly intact to observation

## 2014-11-09 ENCOUNTER — Other Ambulatory Visit: Payer: Self-pay

## 2015-11-11 ENCOUNTER — Encounter: Payer: Self-pay | Admitting: Internal Medicine

## 2015-11-11 ENCOUNTER — Ambulatory Visit (INDEPENDENT_AMBULATORY_CARE_PROVIDER_SITE_OTHER): Payer: Medicare Other | Admitting: Internal Medicine

## 2015-11-11 VITALS — BP 144/84 | HR 44 | Ht 66.5 in | Wt 203.4 lb

## 2015-11-11 DIAGNOSIS — G4733 Obstructive sleep apnea (adult) (pediatric): Secondary | ICD-10-CM

## 2015-11-11 DIAGNOSIS — Z23 Encounter for immunization: Secondary | ICD-10-CM | POA: Diagnosis not present

## 2015-11-11 NOTE — Patient Instructions (Signed)
Order- DME  Advanced please change CPAP to auto 5-11, continue mask of choice, humidifier, supplies, AirView   Needs replacement supplies now   Dx OSA  Order- may return after July 4 for outpatient Prevnar-13 pneumonia vaccine  Try otc Tylenol PM at bedtime if needed for sleep

## 2015-11-11 NOTE — Progress Notes (Signed)
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 Last here in 2010. PCP Dr Zada Girt. NPSG 07/15/06- AHI 36.7/ hr He has been using CPAP 11/Advanced. Having more problems sleeping. Pulses then turns, flipping side to side. Knee pain and allergic nasal congestion as well as bathroom waking repeatedly at night. Or trouble this year with seasonal allergic rhinitis treated with Flonase and Astelin nasal sprays. He has to take the mask off or delay putting it on if his nose is bothering him. Throat clearing bothers him. He is on omeprazole and does not feel reflux events. Usual bedtime between midnight and 1 AM. Sleep latency 10 minutes. Wakes between 3 and 6 times per night before up to 8 AM. 5 or 10 pound weight gain in the last 2 years.  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 Last here in 2010 . PCP Dr Zada Girt. Wears CPAP every night for approximately 5 hours and pressure working well. CPAP 11/[facility]. Machine is at least 68 years old. We discussed timing of replacement and suggested he replace it for wears out so that he has a functioning spare machine.  history of allergic rhinitis. He is satisfied to continue with Astelin and Flonase nasal sprays. He saw no advantage to Dymista.  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 Hx allergic rhinitis Has a new cpap 11/ Advanced and it's working well, wearing every night, has petite nose mask.  Being evaluated for ?angina. Discussed OSA and cardiac disease. Dry irritation back of throat.  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 Hx allergic rhinitis FOLLOWS FOR: wearing cpap 11/ Advanced about 7 hours nightly.  Pt states his mask irritates his mustache, no other complaints with mask/supplies.    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  Needs to update supplies. Wakes up a lot at night, doesn't feel like he is sleeping well at night.   Needs Prevnar13 shot (pt is on Antibiotics right now) frequently up at night because of nocturia [facility] recently for asthmatic bronchitis treated with Z-Pak, albuterol rescue inhaler and prednisone. Better now. Asks Prevnar 13 vaccine but we are going to wait until acute bronchitis completely resolved.  ROS-see HPI Constitutional:   No-   weight loss, night sweats, fevers, chills, fatigue, lassitude. HEENT:   No-  headaches, difficulty swallowing, tooth/dental problems, sore throat,       No-  sneezing, itching, ear ache, +occasional nasal congestion, +post nasal drip,  CV:  No-   chest pain, orthopnea, PND, swelling in lower extremities, anasarca, dizziness, palpitations Resp: No-   shortness of breath with exertion or at rest.              No-   productive cough,  No non-productive cough,  No- coughing up of blood.              No-   change in color of mucus.  No- wheezing.   Skin: No-   rash or lesions. GI:  No-   heartburn, indigestion, abdominal pain, nausea, vomiting,  GU:  MS:  No-   joint pain or swelling.  . Neuro-     nothing unusual Psych:  No- change in mood or affect.  No depression or anxiety.  No memory loss.  OBJ- Physical Exam General- Alert, Oriented, Affect-appropriate, Distress- none acute Skin- rash-none, lesions- none, excoriation- none Lymphadenopathy- none Head- atraumatic            Eyes- Gross vision intact, PERRLA, conjunctivae and secretions clear            Ears- Hearing, canals-normal            Nose- Clear, no-Septal dev, mucus, polyps, erosion, perforation             Throat- Mallampati IV , mucosa? thrush , drainage- none, tonsils- atrophic.  Neck- flexible , trachea midline, no stridor , thyroid nl,  carotid no bruit Chest - symmetrical excursion , unlabored           Heart/CV- RRR-usually slow , no murmur , no gallop  , no rub, nl s1 s2                           - JVD- none , edema- none, stasis changes- none, varices- none           Lung- clear to P&A, wheeze- none, cough- none , dullness-none, rub- none           Chest wall-  Abd-  Br/ Gen/ Rectal- Not done, not indicated Extrem- cyanosis- none, clubbing, none, atrophy- none, strength- nl Neuro- grossly intact to observation

## 2015-11-14 NOTE — Assessment & Plan Note (Signed)
Download confirms good compliance and control. He reports sometimes pressure seems a little high so we are going to try changing to auto Pap 5-11

## 2015-11-22 ENCOUNTER — Ambulatory Visit (INDEPENDENT_AMBULATORY_CARE_PROVIDER_SITE_OTHER): Payer: Medicare Other

## 2015-11-22 DIAGNOSIS — Z23 Encounter for immunization: Secondary | ICD-10-CM | POA: Diagnosis not present

## 2015-11-24 ENCOUNTER — Encounter: Payer: Self-pay | Admitting: Internal Medicine

## 2017-12-19 ENCOUNTER — Encounter: Payer: Self-pay | Admitting: Internal Medicine

## 2017-12-20 ENCOUNTER — Encounter: Payer: Self-pay | Admitting: Internal Medicine

## 2017-12-20 ENCOUNTER — Ambulatory Visit (INDEPENDENT_AMBULATORY_CARE_PROVIDER_SITE_OTHER): Payer: Medicare Other | Admitting: Internal Medicine

## 2017-12-20 DIAGNOSIS — R9439 Abnormal result of other cardiovascular function study: Secondary | ICD-10-CM | POA: Diagnosis not present

## 2017-12-20 DIAGNOSIS — G4733 Obstructive sleep apnea (adult) (pediatric): Secondary | ICD-10-CM

## 2017-12-20 NOTE — Patient Instructions (Signed)
Order- DME [facility]- Please replace old CPAP machine, changing to auto 5-20, mask of choice, humidifier, supplies, AirView  Please call if you have any problems or questions.

## 2017-12-20 NOTE — Progress Notes (Signed)
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  Needs to update supplies. Wakes up a lot at night, doesn't feel like he is sleeping well at night.   Needs Prevnar13 shot (pt is on Antibiotics right now) frequently up at night because of nocturia [facility] for asthmatic bronchitis treated with Z-Pak, albuterol rescue inhaler and prednisone. Better now. Asks Prevnar 13 vaccine but we are going to wait until acute bronchitis completely resolved.  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  Machine is getting old and may be eligible for replacement.  He continues to sleep better with it and does not snore. He denies other significant health problems since last here.   ROS-see HPI   + = positive Constitutional:   No-   weight loss, night sweats, fevers, chills, fatigue, lassitude. HEENT:   No-  headaches, difficulty swallowing, tooth/dental problems, sore throat,       No-  sneezing, itching, ear ache, +occasional nasal congestion, +post nasal drip,  CV:  No-   chest pain, orthopnea, PND, swelling in lower extremities, anasarca, dizziness, palpitations Resp: No-   shortness of breath with exertion or at rest.              No-   productive cough,  No non-productive cough,  No- coughing up of blood.              No-   change in color of mucus.  No- wheezing.   Skin: No-   rash or lesions. GI:  No-   heartburn,  indigestion, abdominal pain, nausea, vomiting,  GU:  MS:  No-   joint pain or swelling.  . Neuro-     nothing unusual Psych:  No- change in mood or affect. No depression or anxiety.  No memory loss.  OBJ- Physical Exam General- Alert, Oriented, Affect-appropriate, Distress- none acute Skin- rash-none, lesions- none, excoriation- none Lymphadenopathy- none Head- atraumatic            Eyes- Gross vision intact, PERRLA, conjunctivae and secretions clear            Ears- Hearing, canals-normal            Nose- Clear, no-Septal dev, mucus, polyps, erosion, perforation             Throat- Mallampati IV , mucosa? thrush , drainage- none, tonsils- atrophic. Throat clearing again noted. Neck- flexible , trachea midline, no stridor , thyroid nl, carotid no bruit Chest - symmetrical excursion , unlabored           Heart/CV- RRR , no murmur , no gallop  , no rub, nl s1 s2                           - JVD- none , edema- none, stasis changes- none, varices- none           Lung- clear to P&A, wheeze- none, cough- none , dullness-none, rub- none  Chest wall-  Abd-  Br/ Gen/ Rectal- Not done, not indicated Extrem- cyanosis- none, clubbing, none, atrophy- none, strength- nl Neuro- grossly intact to observation

## 2017-12-21 NOTE — Assessment & Plan Note (Signed)
He continues to benefit from CPAP and does not sleep without it.  Compliance and control are quite good.  Machine is getting older and may be eligible for replacement. Plan-replace CPAP, changing to auto 5-20 if eligible

## 2017-12-21 NOTE — Assessment & Plan Note (Signed)
He denies acute events and continues follow-up was appropriate for this.

## 2018-02-07 ENCOUNTER — Encounter: Payer: Self-pay | Admitting: Internal Medicine

## 2019-04-02 DIAGNOSIS — M179 Osteoarthritis of knee, unspecified: Secondary | ICD-10-CM | POA: Insufficient documentation

## 2019-06-03 ENCOUNTER — Ambulatory Visit: Payer: Medicare Other | Attending: Internal Medicine

## 2019-06-03 DIAGNOSIS — Z23 Encounter for immunization: Secondary | ICD-10-CM

## 2019-06-03 NOTE — Progress Notes (Signed)
   Covid-19 Vaccination Clinic  Name:  Harry Leach    MRN: [id number] DOB: Jul 11, 1945  06/03/2019  Harry Leach was observed post Covid-19 immunization for 15 minutes without incidence. He was provided with Vaccine Information Sheet and instruction to access the V-Safe system.   Harry Leach was instructed to call 911 with any severe reactions post vaccine: Marland Kitchen Difficulty breathing  . Swelling of your face and throat  . A fast heartbeat  . A bad rash all over your body  . Dizziness and weakness    Immunizations Administered    Name Date Dose VIS Date Route   Pfizer COVID-19 Vaccine 06/03/2019  1:03 PM 0.3 mL 04/25/2019 Intramuscular   Manufacturer: [place]   Lot: [id number]   [place]: [id number]

## 2019-06-24 ENCOUNTER — Ambulatory Visit: Payer: Medicare Other | Attending: Internal Medicine

## 2019-06-24 DIAGNOSIS — Z23 Encounter for immunization: Secondary | ICD-10-CM | POA: Insufficient documentation

## 2019-06-24 NOTE — Progress Notes (Signed)
   Covid-19 Vaccination Clinic  Name:  Harry Leach    MRN: [id number] DOB: 11-08-1945  06/24/2019  Mr. Euresti was observed post Covid-19 immunization for 15 minutes without incidence. He was provided with Vaccine Information Sheet and instruction to access the V-Safe system.   Mr. Arnesen was instructed to call 911 with any severe reactions post vaccine: Marland Kitchen Difficulty breathing  . Swelling of your face and throat  . A fast heartbeat  . A bad rash all over your body  . Dizziness and weakness    Immunizations Administered    Name Date Dose VIS Date Route   Pfizer COVID-19 Vaccine 06/24/2019  1:36 PM 0.3 mL 04/25/2019 Intramuscular   Manufacturer: [place]   Lot: [id number]   [place]: [id number]

## 2019-07-03 ENCOUNTER — Encounter: Payer: Self-pay | Admitting: Internal Medicine

## 2019-07-08 ENCOUNTER — Ambulatory Visit: Payer: Medicare Other

## 2019-07-14 ENCOUNTER — Ambulatory Visit: Payer: Medicare Other | Admitting: Cardiology

## 2019-07-14 ENCOUNTER — Encounter: Payer: Self-pay | Admitting: Cardiology

## 2019-07-14 ENCOUNTER — Other Ambulatory Visit: Payer: Self-pay

## 2019-07-14 VITALS — BP 127/66 | HR 56 | Temp 97.8°F | Resp 14 | Ht 67.0 in | Wt 194.9 lb

## 2019-07-14 DIAGNOSIS — I2510 Atherosclerotic heart disease of native coronary artery without angina pectoris: Secondary | ICD-10-CM

## 2019-07-14 DIAGNOSIS — E1159 Type 2 diabetes mellitus with other circulatory complications: Secondary | ICD-10-CM

## 2019-07-14 DIAGNOSIS — Z87891 Personal history of nicotine dependence: Secondary | ICD-10-CM | POA: Insufficient documentation

## 2019-07-14 DIAGNOSIS — I10 Essential (primary) hypertension: Secondary | ICD-10-CM

## 2019-07-14 DIAGNOSIS — E119 Type 2 diabetes mellitus without complications: Secondary | ICD-10-CM | POA: Insufficient documentation

## 2019-07-14 DIAGNOSIS — G4733 Obstructive sleep apnea (adult) (pediatric): Secondary | ICD-10-CM

## 2019-07-14 MED ORDER — METOPROLOL SUCCINATE ER 25 MG PO TB24: 25.0000 mg | ORAL_TABLET | Freq: Every day | ORAL | 0 refills | Status: DC

## 2019-07-14 NOTE — Progress Notes (Signed)
REASON FOR CONSULT: Bradycardia, hypertension  Chief Complaint  Patient presents with  . Bradycardia  . Coronary Artery Disease    REQUESTING PHYSICIAN:  Sueanne Margarita, DO [place],  [place] [address]  HPI  Harry Leach is a 74 y.o. male who presents to the office with a chief complaint of "low heart rate and established coronary disease." Patient's past medical history and cardiac risk factors include: Hypertension, hyperlipidemia, nonobstructive coronary artery disease, non-insulin-dependent diabetes mellitus type 2, sleep apnea on CPAP machine, former smoker, advanced age.  Coronary artery disease: Patient has history of nonobstructive coronary artery disease and has undergone coronary CTA and left heart catheterization in the past.  Findings noted below for further reference.  Patient denies any chest pain at rest or with effort related activities.  He does not have any shortness of breath with rest or with effort related activities.  His functional status is excellent for his age.  He exercises at least once a week by playing tennis and during this time or so months he actually does yard work weekly basis as well.   Bradycardia: Patient's resting heart rate usually ranges between 45-50 bpm on her current dose of atenolol 50 mg p.o. daily.  Patient denies any symptoms of bradycardia.  When he plays tennis according to his iWatch he is an appropriate ventricular response (heart rate greater than 100 bpm).   External labs reviewed.  Currently patient denies chest pain, shortness of breath at rest or effort related symptoms, lightheadedness, dizziness, palpitations, orthopnea, paroxysmal nocturnal dyspnea, lower extremity swelling, near syncope, syncopal events, hematochezia, hemoptysis, hematemesis, melanotic stools, no symptoms of amaurosis fugax, motor or sensory symptoms or dysphasia in the last 6 months.   History of coronary artery disease. Denies prior  history of myocardial infarction, congestive heart failure, deep venous thrombosis, pulmonary embolism, stroke, transient ischemic attack.  FUNCTIONAL STATUS: Plays tennis atleast once a week and during spring and summer months does yard work.   ALLERGIES: No Known Allergies  MEDICATION LIST PRIOR TO VISIT: Current Outpatient Medications on File Prior to Visit  Medication Sig Dispense Refill  . aspirin 81 MG tablet Take 81 mg by mouth daily.    Marland Kitchen atorvastatin (LIPITOR) 20 MG tablet Take 1 tablet (20 mg total) by mouth daily at 6 PM. 90 tablet 3  . azelastine (ASTELIN) 137 MCG/SPRAY nasal spray 1-2 puffs each nostril once or twice daily when needed 30 mL prn  . finasteride (PROSCAR) 5 MG tablet Take 5 mg by mouth daily.    . fluticasone (FLONASE) 50 MCG/ACT nasal spray 1-2 puffs each nostril once daily at bedtime 16 g prn  . hydrochlorothiazide (HYDRODIURIL) 25 MG tablet Take 25 mg by mouth daily.     . IBUPROFEN PO Take by mouth as needed.    . Lactase (LACTOSE FAST ACTING RELIEF PO) Take 1 tablet by mouth 2 (two) times daily as needed. take with dairy products     . metFORMIN (GLUCOPHAGE) 500 MG tablet Take 500 mg by mouth daily.    . Multiple Vitamin (MULTIVITAMIN) tablet Take 1 tablet by mouth daily.    Marland Kitchen omeprazole (PRILOSEC) 20 MG capsule Take 20 mg by mouth daily.    . Tamsulosin HCl (FLOMAX) 0.4 MG CAPS Take 0.4 mg by mouth 2 (two) times daily.    Marland Kitchen PROAIR HFA 108 (90 Base) MCG/ACT inhaler      No current facility-administered medications on file prior to visit.  PAST MEDICAL HISTORY: Past Medical History:  Diagnosis Date  . Arthritis   . Coronary artery disease    a. Cardiac CTA 7/14: Ca score 684 (97th percentile for age and sex matched controls), possible > 50% calcific stenosis in the mid LAD and distal RCA. =>  LHC 11/28/12:  dLM 40%, oLAD 50%, mLAD 50%, oCFX 30-40%, pRCA 30-50%, posterior AV segment ostial 50%, EF 60-70% (vigorous LV function). Aggressive medical  therapy was recommended.  . Diabetes mellitus without complication ([place])   . GERD (gastroesophageal reflux disease)   . Hyperlipidemia   . Hypertension    stress test 1999/ clearance Dr Nyoka Cowden on chart   EKG 6/12 on chart  . Sleep apnea    last study  2008- MODERATE  Dr Annamaria Boots sleep study EPIC, wears CPAP    PAST SURGICAL HISTORY: Past Surgical History:  Procedure Laterality Date  . CARDIAC CATHETERIZATION    . COLONOSCOPY     x 3/   polypectomy x 1  . FRACTURE SURGERY     arm  . KNEE ARTHROSCOPY     x 3    . TONSILLECTOMY    . TOTAL KNEE ARTHROPLASTY  07/12/2011   Procedure: TOTAL KNEE ARTHROPLASTY;  Surgeon: Gearlean Alf, MD;  Location: [facility];  Service: Orthopedics;  Laterality: Left;    FAMILY HISTORY: The patient family history includes Cancer in his father; Diabetes in his mother; Heart attack in his father; Heart disease in his father; Heart failure in his father; Hypertension in his father and mother; Parkinsonism in his mother; Prostate cancer in his father.   SOCIAL HISTORY:  The patient  reports that he quit smoking about 28 years ago. His smoking use included cigarettes and pipe. He smoked 0.25 packs per day. He has never used smokeless tobacco. He reports current alcohol use of about 3.0 standard drinks of alcohol per week. He reports that he does not use drugs.  14 ORGAN REVIEW OF SYSTEMS: CONSTITUTIONAL: No fever or significant weight loss EYES: No recent significant visual change EARS, NOSE, MOUTH, THROAT: No recent significant change in hearing CARDIOVASCULAR: See discussion in subjective/HPI RESPIRATORY: See discussion in subjective/HPI GASTROINTESTINAL: No recent complaints of abdominal pain GENITOURINARY: No recent significant change in genitourinary status MUSCULOSKELETAL: No recent significant change in musculoskeletal status INTEGUMENTARY: No recent rash NEUROLOGIC: No recent significant change in motor function PSYCHIATRIC: No recent significant  change in mood ENDOCRINOLOGIC: No recent significant change in endocrine status HEMATOLOGIC/LYMPHATIC: No recent significant unexpected bruising ALLERGIC/IMMUNOLOGIC: No recent unexplained allergic reaction  PHYSICAL EXAM: Vitals with BMI 07/14/2019 07/14/2019 12/20/2017  Height - '5\' 7"'$  '5\' 7"'$   Weight - 194 lbs 14 oz 192 lbs 10 oz  BMI - 33.54 56.25  Systolic 638 937 342  Diastolic 66 76 68  Pulse 56 51 53   CONSTITUTIONAL: Well-developed and well-nourished. No acute distress.  SKIN: Skin is warm and dry. No rash noted. No cyanosis. No pallor. No jaundice HEAD: Normocephalic and atraumatic.  EYES: No scleral icterus MOUTH/THROAT: Moist oral membranes.  NECK: No JVD present. No thyromegaly noted. No carotid bruits  LYMPHATIC: No visible cervical adenopathy.  CHEST Normal respiratory effort. No intercostal retractions  LUNGS: Clear to auscultation bilaterally.  No stridor. No wheezes. No rales.  CARDIOVASCULAR: Regular rate and rhythm, positive S1-S2, no murmurs rubs or gallops appreciated. ABDOMINAL: Soft, nontender, nondistended, positive bowel sounds in all 4 quadrants.  No apparent ascites.  EXTREMITIES: No peripheral edema warm to touch bilaterally.  2+ dorsalis pedis and  posterior tibial pulses bilaterally. HEMATOLOGIC: No significant bruising NEUROLOGIC: Oriented to person, place, and time. Nonfocal. Normal muscle tone.  PSYCHIATRIC: Normal mood and affect. Normal behavior. Cooperative  CARDIAC DATABASE: EKG: 07/14/2019: Sinus bradycardia with ventricular rate of 50 bpm poor R wave progression, low voltage in precordial leads, nonspecific T wave abnormality.  Echocardiogram: None  Stress Testing: Outside facility several years ago.   Cardiac CTA 7/14: Ca score 684 (97th percentile for age and sex matched controls), possible > 50% calcific stenosis in the mid LAD and distal RCA.    Heart Catheterization: 7/17/14at [facility] per EMR:  dLM 40%, oLAD 50%, mLAD 50%, oCFX 30-40%,  pRCA 30-50%, posterior AV segment ostial 50%, EF 60-70% (vigorous LV function).   LABORATORY DATA: CBC Latest Ref Rng & Units 11/27/2012 07/15/2011 07/14/2011  WBC 4.5 - 10.5 K/uL 7.0 8.8 11.3(H)  Hemoglobin 13.0 - 17.0 g/dL 14.6 10.8(L) 10.9(L)  Hematocrit 39.0 - 52.0 % 43.5 31.1(L) 31.1(L)  Platelets 150.0 - 400.0 K/uL 164.0 136(L) 134(L)    CMP Latest Ref Rng & Units 02/19/2013 12/31/2012 12/17/2012  Glucose 70 - 99 mg/dL 143(H) 107(H) 97  BUN 6 - 23 mg/dL '19 19 21  '$ Creatinine 0.4 - 1.5 mg/dL 1.4 1.4 1.7(H)  Sodium 135 - 145 mEq/L 142 139 139  Potassium 3.5 - 5.1 mEq/L 4.2 4.0 3.3(L)  Chloride 96 - 112 mEq/L 105 105 101  CO2 19 - 32 mEq/L '29 30 29  '$ Calcium 8.4 - 10.5 mg/dL 9.5 9.1 9.6  Total Protein 6.0 - 8.3 g/dL 6.9 - -  Total Bilirubin 0.3 - 1.2 mg/dL 0.8 - -  Alkaline Phos 39 - 117 U/L 67 - -  AST 0 - 37 U/L 30 - -  ALT 0 - 53 U/L 24 - -   External Labs: Collected: July 2020 Creatinine 1.31 mg/dL. eGFR: 54 mL/min per 1.73 m Lipid profile: Total cholesterol 125, triglycerides 106, HDL 39, LDL 67 Hemoglobin A1c: 5.6  FINAL MEDICATION LIST END OF ENCOUNTER: Meds ordered this encounter  Medications  . metoprolol succinate (TOPROL-XL) 25 MG 24 hr tablet    Sig: Take 1 tablet (25 mg total) by mouth daily. Take with or immediately following a meal.    Dispense:  90 tablet    Refill:  0    Medications Discontinued During This Encounter  Medication Reason  . atenolol (TENORMIN) 50 MG tablet Change in therapy     Current Outpatient Medications:  .  aspirin 81 MG tablet, Take 81 mg by mouth daily., Disp: , Rfl:  .  atorvastatin (LIPITOR) 20 MG tablet, Take 1 tablet (20 mg total) by mouth daily at 6 PM., Disp: 90 tablet, Rfl: 3 .  azelastine (ASTELIN) 137 MCG/SPRAY nasal spray, 1-2 puffs each nostril once or twice daily when needed, Disp: 30 mL, Rfl: prn .  finasteride (PROSCAR) 5 MG tablet, Take 5 mg by mouth daily., Disp: , Rfl:  .  fluticasone (FLONASE) 50 MCG/ACT nasal spray,  1-2 puffs each nostril once daily at bedtime, Disp: 16 g, Rfl: prn .  hydrochlorothiazide (HYDRODIURIL) 25 MG tablet, Take 25 mg by mouth daily. , Disp: , Rfl:  .  IBUPROFEN PO, Take by mouth as needed., Disp: , Rfl:  .  Lactase (LACTOSE FAST ACTING RELIEF PO), Take 1 tablet by mouth 2 (two) times daily as needed. take with dairy products , Disp: , Rfl:  .  metFORMIN (GLUCOPHAGE) 500 MG tablet, Take 500 mg by mouth daily., Disp: , Rfl:  .  Multiple Vitamin (MULTIVITAMIN) tablet, Take 1 tablet by mouth daily., Disp: , Rfl:  .  omeprazole (PRILOSEC) 20 MG capsule, Take 20 mg by mouth daily., Disp: , Rfl:  .  Tamsulosin HCl (FLOMAX) 0.4 MG CAPS, Take 0.4 mg by mouth 2 (two) times daily., Disp: , Rfl:  .  metoprolol succinate (TOPROL-XL) 25 MG 24 hr tablet, Take 1 tablet (25 mg total) by mouth daily. Take with or immediately following a meal., Disp: 90 tablet, Rfl: 0 .  PROAIR HFA 108 (90 Base) MCG/ACT inhaler, , Disp: , Rfl:   IMPRESSION:    ICD-10-CM   1. Nonobstructive atherosclerosis of coronary artery  I25.10 EKG 12-Lead    PCV ECHOCARDIOGRAM COMPLETE    PCV MYOCARDIAL PERFUSION WITH LEXISCAN    metoprolol succinate (TOPROL-XL) 25 MG 24 hr tablet  2. Type 2 diabetes mellitus with other circulatory complication, without long-term current use of insulin (HCC)  E11.59 metoprolol succinate (TOPROL-XL) 25 MG 24 hr tablet  3. Essential hypertension  I10   4. OSA (obstructive sleep apnea)  G47.33   5. Former smoker  Z87.891      RECOMMENDATIONS: Harry Leach is a 74 y.o. male who presents to the office with a chief complaint of "low heart rate and established coronary disease." Patient's past medical history and cardiac risk factors include: Hypertension, hyperlipidemia, nonobstructive coronary artery disease, non-insulin-dependent diabetes mellitus type 2, sleep apnea on CPAP machine, former smoker, advanced age.  Nonobstructive coronary artery disease:  Review of prior records notes  that he underwent a cardiac CTA back in July 2014.  There was concern for obstructive coronary artery disease and therefore underwent subsequent left heart catheterization in July 2014 results noted above.  Currently denies any active chest pain at rest or with effort related activities.  Medications reconciled.  LDL currently at goal.  Hemoglobin A1c currently at goal.  Blood pressure currently at goal and on medical therapy.  Patient is compliant with his CPAP.   Plan echocardiogram to evaluate for left ventricular systolic function and structural heart disease.  Plan nuclear stress test to rule out reversible ischemia.  Asymptomatic bradycardia:  Patient's resting heart rate ranges between 45-50 bpm and with exercises he does experience an appropriate ventricular response with a heart rate being greater than 100 bpm per his iWatch.   In the setting of nonobstructive coronary artery disease will transition him from atenolol to metoprolol 25 mg p.o. daily.  Labs reviewed.  EKG performed, interpreted and noted above for the reference.  Non-insulin-dependent diabetes mellitus type 2: Hemoglobin A1c reviewed.  Currently managed per primary team.  Benign essential hypertension: Currently at goal.  Medications reviewed.   Orders Placed This Encounter  Procedures  . PCV MYOCARDIAL PERFUSION WITH LEXISCAN  . EKG 12-Lead  . PCV ECHOCARDIOGRAM COMPLETE   --Continue cardiac medications as reconciled in final medication list. --Return in about 3 months (around 10/14/2019) for Discussion of test results.. Or sooner if needed. --Continue follow-up with your primary care physician regarding the management of your other chronic comorbid conditions.  Patient's questions and concerns were addressed to his satisfaction. He voices understanding of the instructions provided during this encounter.   This note was created using a voice recognition software as a result there may be grammatical  errors inadvertently enclosed that do not reflect the nature of this encounter. Every attempt is made to correct such errors.  Rex Kras, DO, [facility]. [place] Office: [phone]

## 2019-07-14 NOTE — Patient Instructions (Signed)
Please remember to bring in your medication bottles in at the next visit.   New Medications that were added at today's visit:  Toprol XL 25mg  po qday   Medications that were discontinued at today's visit: Atenolol   Office will call you to have the following tests scheduled:  Echo  Stress Test   Recommend follow up with your PCP as scheduled.

## 2019-07-24 ENCOUNTER — Ambulatory Visit: Payer: Medicare Other

## 2019-07-24 ENCOUNTER — Other Ambulatory Visit: Payer: Self-pay

## 2019-07-24 DIAGNOSIS — I2510 Atherosclerotic heart disease of native coronary artery without angina pectoris: Secondary | ICD-10-CM

## 2019-07-28 ENCOUNTER — Other Ambulatory Visit: Payer: Self-pay

## 2019-07-28 ENCOUNTER — Ambulatory Visit: Payer: Medicare Other

## 2019-07-28 DIAGNOSIS — I2510 Atherosclerotic heart disease of native coronary artery without angina pectoris: Secondary | ICD-10-CM

## 2019-07-30 ENCOUNTER — Telehealth: Payer: Self-pay

## 2019-07-30 NOTE — Telephone Encounter (Signed)
-----   Message from [place], [place] sent at 07/29/2019  4:26 PM EDT ----- Please have patient come in for a office visit to review his stress test. Stress test was reported to be low risk study.

## 2019-07-30 NOTE — Telephone Encounter (Signed)
Spoke with member of the household who stated the patient was unavailable at the time. Gave contact information and was told patient will call back later today in regards to his stress test.

## 2019-07-31 ENCOUNTER — Other Ambulatory Visit: Payer: Self-pay

## 2019-07-31 ENCOUNTER — Encounter: Payer: Self-pay | Admitting: Cardiology

## 2019-07-31 ENCOUNTER — Ambulatory Visit: Payer: Medicare Other | Admitting: Cardiology

## 2019-07-31 VITALS — BP 145/69 | HR 66 | Temp 97.6°F | Ht 67.0 in | Wt 189.0 lb

## 2019-07-31 DIAGNOSIS — Z712 Person consulting for explanation of examination or test findings: Secondary | ICD-10-CM

## 2019-07-31 DIAGNOSIS — I10 Essential (primary) hypertension: Secondary | ICD-10-CM

## 2019-07-31 DIAGNOSIS — I2510 Atherosclerotic heart disease of native coronary artery without angina pectoris: Secondary | ICD-10-CM

## 2019-07-31 DIAGNOSIS — G4733 Obstructive sleep apnea (adult) (pediatric): Secondary | ICD-10-CM

## 2019-07-31 DIAGNOSIS — Z87891 Personal history of nicotine dependence: Secondary | ICD-10-CM

## 2019-07-31 DIAGNOSIS — E1159 Type 2 diabetes mellitus with other circulatory complications: Secondary | ICD-10-CM

## 2019-07-31 NOTE — Progress Notes (Signed)
Chief Complaint  Patient presents with  . Results    Follow up   HPI  Harry Leach is a 74 y.o. male who presents to the office with a chief complaint of "follow-up on test results." Patient's past medical history and cardiac risk factors include: Hypertension, hyperlipidemia, nonobstructive coronary artery disease, non-insulin-dependent diabetes mellitus type 2, sleep apnea on CPAP machine, former smoker, advanced age.  Coronary artery disease: Patient has history of nonobstructive coronary artery disease and has undergone coronary CTA and left heart catheterization in the past.  Findings noted below for further reference.  Patient denies any chest pain at rest or with effort related activities.  He does not have any shortness of breath with rest or with effort related activities.  His functional status is excellent for his age.  He exercises at least once a week by playing tennis and during this time he actually does yard work weekly basis as well.   Bradycardia: Patient is originally on atenolol 50 mg p.o. daily and his resting heart rate would be between 40-50 bpm.  However, in the setting of nonobstructive coronary artery disease his beta-blocker therapy was transitioned to Toprol-XL at the last office visit.  Since the transition to Toprol patient states that his heart rate is between 60-70 bpm resting and rises appropriately with exercise.  He remains asymptomatic.    Reviewed the results of the stress test and echocardiogram that were performed since last office visit.  I reviewed the findings of the nuclear stress test with the patient as well as the images.  Currently patient denies chest pain, shortness of breath at rest or effort related symptoms, lightheadedness, dizziness, palpitations, orthopnea, paroxysmal nocturnal dyspnea, lower extremity swelling, near syncope, syncopal events, hematochezia, hemoptysis, hematemesis, melanotic stools, no symptoms of amaurosis fugax, motor  or sensory symptoms or dysphasia in the last 6 months.   History of coronary artery disease. Denies prior history of myocardial infarction, congestive heart failure, deep venous thrombosis, pulmonary embolism, stroke, transient ischemic attack.  FUNCTIONAL STATUS: Plays tennis atleast once a week and during spring and summer months does yard work.   ALLERGIES: No Known Allergies  MEDICATION LIST PRIOR TO VISIT: Current Outpatient Medications on File Prior to Visit  Medication Sig Dispense Refill  . aspirin 81 MG tablet Take 81 mg by mouth daily.    Marland Kitchen atorvastatin (LIPITOR) 20 MG tablet Take 1 tablet (20 mg total) by mouth daily at 6 PM. 90 tablet 3  . azelastine (ASTELIN) 137 MCG/SPRAY nasal spray 1-2 puffs each nostril once or twice daily when needed 30 mL prn  . finasteride (PROSCAR) 5 MG tablet Take 5 mg by mouth daily.    . fluticasone (FLONASE) 50 MCG/ACT nasal spray 1-2 puffs each nostril once daily at bedtime 16 g prn  . hydrochlorothiazide (HYDRODIURIL) 25 MG tablet Take 25 mg by mouth daily.     . IBUPROFEN PO Take by mouth as needed.    . Lactase (LACTOSE FAST ACTING RELIEF PO) Take 1 tablet by mouth 2 (two) times daily as needed. take with dairy products     . metFORMIN (GLUCOPHAGE) 500 MG tablet Take 500 mg by mouth daily.    . metoprolol succinate (TOPROL-XL) 25 MG 24 hr tablet Take 1 tablet (25 mg total) by mouth daily. Take with or immediately following a meal. 90 tablet 0  . Multiple Vitamin (MULTIVITAMIN) tablet Take 1 tablet by mouth daily.    Marland Kitchen omeprazole (PRILOSEC) 20 MG capsule Take 20  mg by mouth daily.    Marland Kitchen PROAIR HFA 108 (90 Base) MCG/ACT inhaler     . Tamsulosin HCl (FLOMAX) 0.4 MG CAPS Take 0.4 mg by mouth 2 (two) times daily.     No current facility-administered medications on file prior to visit.    PAST MEDICAL HISTORY: Past Medical History:  Diagnosis Date  . Arthritis   . Coronary artery disease    a. Cardiac CTA 7/14: Ca score 684 (97th percentile  for age and sex matched controls), possible > 50% calcific stenosis in the mid LAD and distal RCA. =>  LHC 11/28/12:  dLM 40%, oLAD 50%, mLAD 50%, oCFX 30-40%, pRCA 30-50%, posterior AV segment ostial 50%, EF 60-70% (vigorous LV function). Aggressive medical therapy was recommended.  . Diabetes mellitus without complication ([facility])   . GERD (gastroesophageal reflux disease)   . Hyperlipidemia   . Hypertension    stress test 1999/ clearance Dr Nyoka Cowden on chart   EKG 6/12 on chart  . Sleep apnea    last study  2008- MODERATE  Dr Annamaria Boots sleep study EPIC, wears CPAP    PAST SURGICAL HISTORY: Past Surgical History:  Procedure Laterality Date  . CARDIAC CATHETERIZATION    . COLONOSCOPY     x 3/   polypectomy x 1  . FRACTURE SURGERY     arm  . KNEE ARTHROSCOPY     x 3    . TONSILLECTOMY    . TOTAL KNEE ARTHROPLASTY  07/12/2011   Procedure: TOTAL KNEE ARTHROPLASTY;  Surgeon: Gearlean Alf, MD;  Location: [facility];  Service: Orthopedics;  Laterality: Left;    FAMILY HISTORY: The patient family history includes Cancer in his father; Diabetes in his mother; Heart attack in his father; Heart disease in his father; Heart failure in his father; Hypertension in his father and mother; Parkinsonism in his mother; Prostate cancer in his father.   SOCIAL HISTORY:  The patient  reports that he quit smoking about 28 years ago. His smoking use included cigarettes and pipe. He smoked 0.25 packs per day. He has never used smokeless tobacco. He reports current alcohol use of about 3.0 standard drinks of alcohol per week. He reports that he does not use drugs.  14 ORGAN REVIEW OF SYSTEMS: CONSTITUTIONAL: No fever or significant weight loss EYES: No recent significant visual change EARS, NOSE, MOUTH, THROAT: No recent significant change in hearing CARDIOVASCULAR: See discussion in subjective/HPI RESPIRATORY: See discussion in subjective/HPI GASTROINTESTINAL: No recent complaints of abdominal pain GENITOURINARY:  No recent significant change in genitourinary status MUSCULOSKELETAL: No recent significant change in musculoskeletal status INTEGUMENTARY: No recent rash NEUROLOGIC: No recent significant change in motor function PSYCHIATRIC: No recent significant change in mood ENDOCRINOLOGIC: No recent significant change in endocrine status HEMATOLOGIC/LYMPHATIC: No recent significant unexpected bruising ALLERGIC/IMMUNOLOGIC: No recent unexplained allergic reaction  PHYSICAL EXAM: Vitals with BMI 07/31/2019 07/14/2019 07/14/2019  Height '5\' 7"'$  - '5\' 7"'$   Weight 189 lbs - 194 lbs 14 oz  BMI 45.99 - 77.41  Systolic 423 953 202  Diastolic 69 66 76  Pulse 66 56 51   CONSTITUTIONAL: Well-developed and well-nourished. No acute distress.  SKIN: Skin is warm and dry. No rash noted. No cyanosis. No pallor. No jaundice HEAD: Normocephalic and atraumatic.  EYES: No scleral icterus MOUTH/THROAT: Moist oral membranes.  NECK: No JVD present. No thyromegaly noted. No carotid bruits  LYMPHATIC: No visible cervical adenopathy.  CHEST Normal respiratory effort. No intercostal retractions  LUNGS: Clear to auscultation bilaterally.  No stridor. No wheezes. No rales.  CARDIOVASCULAR: Regular rate and rhythm, positive S1-S2, no murmurs rubs or gallops appreciated. ABDOMINAL: Soft, nontender, nondistended, positive bowel sounds in all 4 quadrants.  No apparent ascites.  EXTREMITIES: No peripheral edema warm to touch bilaterally.  2+ dorsalis pedis and posterior tibial pulses bilaterally. HEMATOLOGIC: No significant bruising NEUROLOGIC: Oriented to person, place, and time. Nonfocal. Normal muscle tone.  PSYCHIATRIC: Normal mood and affect. Normal behavior. Cooperative  CARDIAC DATABASE: EKG: 07/14/2019: Sinus bradycardia with ventricular rate of 50 bpm poor R wave progression, low voltage in precordial leads, nonspecific T wave abnormality.  Echocardiogram: 07/24/2019: LVEF 60-65%, septal basal hypertrophy, normal diastolic  filling pattern, mild MR, mild TR, no pulmonary hypertension, mild PR.  Stress Testing:  Lexiscan/Modified Bruce protocol Tetrofosmin stress test 07/28/2019:  Stress EKG is non-diagnostic, as this is pharmacological stress test. Additionally, patient exercised on the Modified Bruce protocol. Rest and stress EKG at 70% MPHR shows sinus rhythm, 59m horizontal ST depression in inferolateral leads.  Small area of mild intensity perfusion defect in inferior myocardium. Stress LVEF 64%. Low risk study.  Cardiac CTA 7/14: Ca score 684 (97th percentile for age and sex matched controls), possible > 50% calcific stenosis in the mid LAD and distal RCA.    Heart Catheterization: 7/17/14at [facility] per EMR:  dLM 40%, oLAD 50%, mLAD 50%, oCFX 30-40%, pRCA 30-50%, posterior AV segment ostial 50%, EF 60-70% (vigorous LV function).   LABORATORY DATA: CBC Latest Ref Rng & Units 11/27/2012 07/15/2011 07/14/2011  WBC 4.5 - 10.5 K/uL 7.0 8.8 11.3(H)  Hemoglobin 13.0 - 17.0 g/dL 14.6 10.8(L) 10.9(L)  Hematocrit 39.0 - 52.0 % 43.5 31.1(L) 31.1(L)  Platelets 150.0 - 400.0 K/uL 164.0 136(L) 134(L)    CMP Latest Ref Rng & Units 02/19/2013 12/31/2012 12/17/2012  Glucose 70 - 99 mg/dL 143(H) 107(H) 97  BUN 6 - 23 mg/dL '19 19 21  '$ Creatinine 0.4 - 1.5 mg/dL 1.4 1.4 1.7(H)  Sodium 135 - 145 mEq/L 142 139 139  Potassium 3.5 - 5.1 mEq/L 4.2 4.0 3.3(L)  Chloride 96 - 112 mEq/L 105 105 101  CO2 19 - 32 mEq/L '29 30 29  '$ Calcium 8.4 - 10.5 mg/dL 9.5 9.1 9.6  Total Protein 6.0 - 8.3 g/dL 6.9 - -  Total Bilirubin 0.3 - 1.2 mg/dL 0.8 - -  Alkaline Phos 39 - 117 U/L 67 - -  AST 0 - 37 U/L 30 - -  ALT 0 - 53 U/L 24 - -   External Labs: Collected: July 2020 Creatinine 1.31 mg/dL. eGFR: 54 mL/min per 1.73 m Lipid profile: Total cholesterol 125, triglycerides 106, HDL 39, LDL 67 Hemoglobin A1c: 5.6  FINAL MEDICATION LIST END OF ENCOUNTER: No orders of the defined types were placed in this encounter.   There are no  discontinued medications.   Current Outpatient Medications:  .  aspirin 81 MG tablet, Take 81 mg by mouth daily., Disp: , Rfl:  .  atorvastatin (LIPITOR) 20 MG tablet, Take 1 tablet (20 mg total) by mouth daily at 6 PM., Disp: 90 tablet, Rfl: 3 .  azelastine (ASTELIN) 137 MCG/SPRAY nasal spray, 1-2 puffs each nostril once or twice daily when needed, Disp: 30 mL, Rfl: prn .  finasteride (PROSCAR) 5 MG tablet, Take 5 mg by mouth daily., Disp: , Rfl:  .  fluticasone (FLONASE) 50 MCG/ACT nasal spray, 1-2 puffs each nostril once daily at bedtime, Disp: 16 g, Rfl: prn .  hydrochlorothiazide (HYDRODIURIL) 25 MG tablet, Take 25  mg by mouth daily. , Disp: , Rfl:  .  IBUPROFEN PO, Take by mouth as needed., Disp: , Rfl:  .  Lactase (LACTOSE FAST ACTING RELIEF PO), Take 1 tablet by mouth 2 (two) times daily as needed. take with dairy products , Disp: , Rfl:  .  metFORMIN (GLUCOPHAGE) 500 MG tablet, Take 500 mg by mouth daily., Disp: , Rfl:  .  metoprolol succinate (TOPROL-XL) 25 MG 24 hr tablet, Take 1 tablet (25 mg total) by mouth daily. Take with or immediately following a meal., Disp: 90 tablet, Rfl: 0 .  Multiple Vitamin (MULTIVITAMIN) tablet, Take 1 tablet by mouth daily., Disp: , Rfl:  .  omeprazole (PRILOSEC) 20 MG capsule, Take 20 mg by mouth daily., Disp: , Rfl:  .  PROAIR HFA 108 (90 Base) MCG/ACT inhaler, , Disp: , Rfl:  .  Tamsulosin HCl (FLOMAX) 0.4 MG CAPS, Take 0.4 mg by mouth 2 (two) times daily., Disp: , Rfl:   IMPRESSION:    ICD-10-CM   1. Nonobstructive atherosclerosis of coronary artery  I25.10   2. Encounter to discuss test results  Z71.2   3. Type 2 diabetes mellitus with other circulatory complication, without long-term current use of insulin (HCC)  E11.59   4. Essential hypertension  I10   5. OSA (obstructive sleep apnea)  G47.33   6. Former smoker  Z87.891      RECOMMENDATIONS: Harry Leach is a 74 y.o. male whose  past medical history and cardiac risk factors  include: Hypertension, hyperlipidemia, nonobstructive coronary artery disease, non-insulin-dependent diabetes mellitus type 2, sleep apnea on CPAP machine, former smoker, advanced age.  Nonobstructive coronary artery disease:  Review of prior records notes that he underwent a cardiac CTA back in July 2014.  There was concern for obstructive coronary artery disease and therefore underwent subsequent left heart catheterization in July 2014 results noted above.  Currently denies any active chest pain at rest or with effort related activities.  Medications reconciled.  LDL currently at goal.  Hemoglobin A1c currently at goal.  Patient is compliant with his CPAP.   He continues to play tennis without any chest pain or anginal equivalents.  His overall functional status remains stable.  Echocardiogram results reviewed with the patient in great detail today's office visit.  Also reviewed her most recent nuclear stress test results with the patient which was reported to be low risk.  The images of the nuclear stress test were reviewed with the patient as well.  Continue medical therapy as the patient is asymptomatic, LVEF is preserved, the perfusion defect is small in size.  However, he is educated on seeking medical attention if he develops any new symptoms of chest pain or shortness of breath.  Asymptomatic bradycardia: Resolved  Patient's resting heart rate ranges between 45-50 bpm and with exercises he does experience an appropriate ventricular response with a heart rate being greater than 100 bpm per his iWatch this was while he was on atenolol.   At last visit he was transitioned to metoprolol 25 mg p.o. daily.  His resting heart rate has come up and has an appropriate chronotropic competence with exercise.   Non-insulin-dependent diabetes mellitus type 2: Hemoglobin A1c reviewed.  Currently managed per primary team.  Benign essential hypertension: Currently at goal.  Medications  reviewed.  Evaluation and management (42mn) with time spent  reviewing studies, greater than 50% of that time was spent in counseling and coordination care with the patient regarding complex decision making and discussion  as state above, and documenting clinical evaluation.  --Continue cardiac medications as reconciled in final medication list. --Return in about 6 months (around 01/31/2020).. Or sooner if needed. --Continue follow-up with your primary care physician regarding the management of your other chronic comorbid conditions.  Patient's questions and concerns were addressed to his satisfaction. He voices understanding of the instructions provided during this encounter.   This note was created using a voice recognition software as a result there may be grammatical errors inadvertently enclosed that do not reflect the nature of this encounter. Every attempt is made to correct such errors.  Rex Kras, DO, [facility]. [place] Office: [phone]

## 2019-10-07 ENCOUNTER — Other Ambulatory Visit: Payer: Self-pay

## 2019-10-07 DIAGNOSIS — I2510 Atherosclerotic heart disease of native coronary artery without angina pectoris: Secondary | ICD-10-CM

## 2019-10-07 DIAGNOSIS — E1159 Type 2 diabetes mellitus with other circulatory complications: Secondary | ICD-10-CM

## 2019-10-07 MED ORDER — METOPROLOL SUCCINATE ER 25 MG PO TB24: 25.0000 mg | ORAL_TABLET | Freq: Every day | ORAL | 1 refills | Status: DC

## 2019-10-15 ENCOUNTER — Ambulatory Visit: Payer: Medicare Other | Admitting: Cardiology

## 2019-11-27 ENCOUNTER — Other Ambulatory Visit: Payer: Self-pay | Admitting: Internal Medicine

## 2019-11-27 DIAGNOSIS — R7989 Other specified abnormal findings of blood chemistry: Secondary | ICD-10-CM

## 2019-12-10 ENCOUNTER — Ambulatory Visit
Admission: RE | Admit: 2019-12-10 | Discharge: 2019-12-10 | Disposition: A | Discharge status: Home / Self Care | Payer: Medicare Other | Source: Ambulatory Visit | Attending: Internal Medicine | Admitting: Internal Medicine

## 2019-12-10 DIAGNOSIS — R7989 Other specified abnormal findings of blood chemistry: Secondary | ICD-10-CM

## 2019-12-26 ENCOUNTER — Encounter: Payer: Self-pay | Admitting: Nurse Practitioner

## 2020-01-01 ENCOUNTER — Encounter: Payer: Self-pay | Admitting: Internal Medicine

## 2020-01-01 ENCOUNTER — Ambulatory Visit (INDEPENDENT_AMBULATORY_CARE_PROVIDER_SITE_OTHER): Payer: Medicare Other | Admitting: Internal Medicine

## 2020-01-01 ENCOUNTER — Other Ambulatory Visit: Payer: Self-pay

## 2020-01-01 VITALS — BP 124/72 | HR 74 | Temp 96.7°F | Ht 67.0 in | Wt 186.4 lb

## 2020-01-01 DIAGNOSIS — J302 Other seasonal allergic rhinitis: Secondary | ICD-10-CM | POA: Diagnosis not present

## 2020-01-01 DIAGNOSIS — G4733 Obstructive sleep apnea (adult) (pediatric): Secondary | ICD-10-CM | POA: Diagnosis not present

## 2020-01-01 DIAGNOSIS — J3089 Other allergic rhinitis: Secondary | ICD-10-CM | POA: Diagnosis not present

## 2020-01-01 NOTE — Progress Notes (Signed)
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  Machine is getting old and may be eligible for replacement.  He continues to sleep better with it and does not snore. He denies other significant health problems since last here.  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 Frequent nasal congestion bothers him, using Flonase, Azelastine. Sleeps on sides.  Had 2 Phizer Covax.  ROS-see HPI   + = positive Constitutional:   No-   weight loss, night sweats, fevers, chills, fatigue, lassitude. HEENT:   No-  headaches, difficulty swallowing, tooth/dental problems, sore throat,       No-  sneezing, itching, ear ache, + nasal congestion, +post nasal drip,  CV:  No-   chest pain, orthopnea, PND, swelling in lower extremities, anasarca, dizziness, palpitations Resp: No-   shortness of breath with exertion or at rest.              No-   productive cough,  No non-productive cough,  No- coughing up of blood.              No-   change in color of mucus.  No- wheezing.   Skin: No-   rash or lesions. GI:  No-   heartburn, indigestion, abdominal pain, nausea, vomiting,  GU:  MS:  No-   joint pain or swelling.  . Neuro-     nothing unusual Psych:  No- change in mood or affect. No depression or anxiety.  No memory loss.  OBJ- Physical Exam General- Alert, Oriented, Affect-appropriate, Distress- none  acute Skin- rash-none, lesions- none, excoriation- none Lymphadenopathy- none Head- atraumatic            Eyes- Gross vision intact, PERRLA, conjunctivae and secretions clear            Ears- Hearing, canals-normal            Nose- Clear, no-Septal dev, mucus, polyps, erosion, perforation             Throat- Mallampati IV , mucosa? thrush , drainage- none, tonsils- atrophic. Throat clearing again noted. Neck- flexible , trachea midline, no stridor , thyroid nl, carotid no bruit Chest - symmetrical excursion , unlabored           Heart/CV- RRR , no murmur , no gallop  , no rub, nl s1 s2                           - JVD- none , edema- none, stasis changes- none, varices- none, no carotid bruit heard           Lung- clear to P&A, wheeze- none, cough- none , dullness-none, rub- none           Chest wall-  Abd-  Br/ Gen/ Rectal- Not done, not indicated Extrem- cyanosis- none, clubbing, none, atrophy- none, strength- nl Neuro- grossly intact to observation

## 2020-01-01 NOTE — Patient Instructions (Signed)
Order- DME [facility]   Please change auto range to 10-15, continue mask of choice, supplies, AirView/ card  Consider trial of otc nasal spray Nasalcrom/ cromolyn/ cromol  You can ask your cardiologist if central apneas imply any question about blood flow into your brain at night.  Please call as needed

## 2020-01-19 NOTE — Assessment & Plan Note (Signed)
Benefits from CPAP with good compliance and good control of obstructive apneas. Having breakthrough central apneas Plan- change auto to 10-15 for trial. Consider BiPap titration if central apneas seem important

## 2020-01-19 NOTE — Assessment & Plan Note (Signed)
More persistently bothersome. Don't see polyps anteriorly. Plan- trial of Nasalcrom. Consider ENT or Allergist

## 2020-01-27 ENCOUNTER — Ambulatory Visit: Payer: Medicare Other | Admitting: Cardiology

## 2020-01-28 ENCOUNTER — Ambulatory Visit: Payer: Medicare Other | Admitting: Cardiology

## 2020-01-28 ENCOUNTER — Other Ambulatory Visit: Payer: Self-pay

## 2020-01-28 ENCOUNTER — Encounter: Payer: Self-pay | Admitting: Cardiology

## 2020-01-28 VITALS — BP 140/80 | HR 68 | Resp 16 | Ht 67.0 in | Wt 187.0 lb

## 2020-01-28 DIAGNOSIS — I2510 Atherosclerotic heart disease of native coronary artery without angina pectoris: Secondary | ICD-10-CM

## 2020-01-28 DIAGNOSIS — G4733 Obstructive sleep apnea (adult) (pediatric): Secondary | ICD-10-CM

## 2020-01-28 DIAGNOSIS — Z87891 Personal history of nicotine dependence: Secondary | ICD-10-CM

## 2020-01-28 DIAGNOSIS — E1159 Type 2 diabetes mellitus with other circulatory complications: Secondary | ICD-10-CM

## 2020-01-28 DIAGNOSIS — I10 Essential (primary) hypertension: Secondary | ICD-10-CM

## 2020-01-28 MED ORDER — METOPROLOL SUCCINATE ER 25 MG PO TB24: 25.0000 mg | ORAL_TABLET | Freq: Every morning | ORAL | 0 refills | Status: DC

## 2020-01-28 NOTE — Progress Notes (Signed)
Chief Complaint  Patient presents with  . Coronary Artery Disease  . Follow-up    6  month    HPI  Harry Leach is a 74 y.o. male who presents to the office with a chief complaint of "69-monthfollow-up for management of nonobstructive CAD." Patient's past medical history and cardiac risk factors include: Hypertension, hyperlipidemia, nonobstructive coronary artery disease, non-insulin-dependent diabetes mellitus type 2, sleep apnea on CPAP machine, former smoker, advanced age.  Coronary artery disease: Patient has history of nonobstructive coronary artery disease and has undergone coronary CTA and left heart catheterization in the past.  Findings noted below for further reference.  Since the last visit patient denies any chest pain at rest or with effort related activities.  He does not have any shortness of breath with rest or with effort related activities.  His functional status is excellent for his age.  He is playing more tennis compared to before and has no effort related symptoms. He also continues to do his yardwork on regular basis.   History of coronary artery disease. Denies prior history of myocardial infarction, congestive heart failure, deep venous thrombosis, pulmonary embolism, stroke, transient ischemic attack.  FUNCTIONAL STATUS: Plays tennis atleast once a week and during spring and summer months does yard work.   ALLERGIES: No Known Allergies  MEDICATION LIST PRIOR TO VISIT: Current Outpatient Medications on File Prior to Visit  Medication Sig Dispense Refill  . aspirin 81 MG tablet Take 81 mg by mouth daily.    .Marland Kitchenatorvastatin (LIPITOR) 20 MG tablet Take 1 tablet (20 mg total) by mouth daily at 6 PM. 90 tablet 3  . azelastine (ASTELIN) 137 MCG/SPRAY nasal spray 1-2 puffs each nostril once or twice daily when needed 30 mL prn  . cetirizine (ZYRTEC) 10 MG tablet Take 10 mg by mouth daily.    . finasteride (PROSCAR) 5 MG tablet Take 5 mg by mouth daily.    .  fluticasone (FLONASE) 50 MCG/ACT nasal spray 1-2 puffs each nostril once daily at bedtime 16 g prn  . hydrochlorothiazide (HYDRODIURIL) 25 MG tablet Take 25 mg by mouth daily.     . IBUPROFEN PO Take by mouth as needed.    . Lactase (LACTOSE FAST ACTING RELIEF PO) Take 1 tablet by mouth 2 (two) times daily as needed. take with dairy products     . metFORMIN (GLUCOPHAGE) 500 MG tablet Take 500 mg by mouth daily.    . Multiple Vitamin (MULTIVITAMIN) tablet Take 1 tablet by mouth daily.    .Marland Kitchenomeprazole (PRILOSEC) 20 MG capsule Take 20 mg by mouth daily.    . Tamsulosin HCl (FLOMAX) 0.4 MG CAPS Take 0.4 mg by mouth 2 (two) times daily.     No current facility-administered medications on file prior to visit.   PAST MEDICAL HISTORY: Past Medical History:  Diagnosis Date  . Arthritis   . Coronary artery disease    a. Cardiac CTA 7/14: Ca score 684 (97th percentile for age and sex matched controls), possible > 50% calcific stenosis in the mid LAD and distal RCA. =>  LHC 11/28/12:  dLM 40%, oLAD 50%, mLAD 50%, oCFX 30-40%, pRCA 30-50%, posterior AV segment ostial 50%, EF 60-70% (vigorous LV function). Aggressive medical therapy was recommended.  . [place]   . GERD (gastroesophageal reflux disease)   . Hyperlipidemia   . Hypertension    stress test 1999/ clearance Dr GNyoka Cowdenon chart   EKG 6/12 on chart  .  Sleep apnea    last study  2008- MODERATE  Dr Annamaria Boots sleep study EPIC, wears CPAP   PAST SURGICAL HISTORY: Past Surgical History:  Procedure Laterality Date  . CARDIAC CATHETERIZATION    . COLONOSCOPY     x 3/   polypectomy x 1  . FRACTURE SURGERY     arm  . KNEE ARTHROSCOPY     x 3    . TONSILLECTOMY    . TOTAL KNEE ARTHROPLASTY  07/12/2011   Procedure: TOTAL KNEE ARTHROPLASTY;  Surgeon: Gearlean Alf, MD;  Location: [facility];  Service: Orthopedics;  Laterality: Left;   FAMILY HISTORY: family history includes Cancer in his father; Diabetes in his  mother; Heart attack in his father; Heart disease in his father; Heart failure in his father; Hypertension in his father and mother; Parkinsonism in his mother; Prostate cancer in his father.  SOCIAL HISTORY:  Social History   Tobacco Use  . Smoking status: Former Smoker    Packs/day: 0.25    Years: 10.00    Pack years: 2.50    Types: Cigarettes, Pipe    Quit date: 07/05/1991    Years since quitting: 28.6  . Smokeless tobacco: Never Used  . Tobacco comment: cigs in college,  Vaping Use  . Vaping Use: Never used  Substance Use Topics  . Alcohol use: Yes    Alcohol/week: 3.0 standard drinks    Types: 3 Cans of beer per week    Comment: 2-4 beers week  . Drug use: No    Review of Systems  Constitutional: Negative for chills and fever.  HENT: Negative for hoarse voice and nosebleeds.   Eyes: Negative for discharge, double vision and pain.  Cardiovascular: Negative for chest pain, claudication, dyspnea on exertion, leg swelling, near-syncope, orthopnea, palpitations, paroxysmal nocturnal dyspnea and syncope.  Respiratory: Negative for hemoptysis and shortness of breath.   Musculoskeletal: Negative for muscle cramps and myalgias.  Gastrointestinal: Negative for abdominal pain, constipation, diarrhea, hematemesis, hematochezia, melena, nausea and vomiting.  Neurological: Negative for dizziness and light-headedness.   PHYSICAL EXAM: Vitals with BMI 01/28/2020 01/01/2020 07/31/2019  Height _0  _1  _2   Weight 187 lbs 186 lbs 6 oz 189 lbs  BMI 29.28 41.74 08.14  Systolic 481 856 314  Diastolic 80 72 69  Pulse 68 74 66   CONSTITUTIONAL: Well-developed and well-nourished. No acute distress.  SKIN: Skin is warm and dry. No rash noted. No cyanosis. No pallor. No jaundice HEAD: Normocephalic and atraumatic.  EYES: No scleral icterus MOUTH/THROAT: Moist oral membranes.  NECK: No JVD present. No thyromegaly noted. No carotid bruits  LYMPHATIC: No visible cervical adenopathy.    CHEST Normal respiratory effort. No intercostal retractions  LUNGS: Clear to auscultation bilaterally.  No stridor. No wheezes. No rales.  CARDIOVASCULAR: Regular rate and rhythm, positive S1-S2, no murmurs rubs or gallops appreciated. ABDOMINAL: Soft, nontender, nondistended, positive bowel sounds in all 4 quadrants.  No apparent ascites.  EXTREMITIES: No peripheral edema warm to touch bilaterally.  2+ dorsalis pedis and posterior tibial pulses bilaterally. HEMATOLOGIC: No significant bruising NEUROLOGIC: Oriented to person, place, and time. Nonfocal. Normal muscle tone.  PSYCHIATRIC: Normal mood and affect. Normal behavior. Cooperative  CARDIAC DATABASE: EKG: 01/28/2020: Sinus bradycardia, 55 bpm, normal axis, left atrial enlargement, poor R wave progression, nonspecific ST depression.  Echocardiogram: 07/24/2019: LVEF 60-65%, septal basal hypertrophy, normal diastolic filling pattern, mild MR, mild TR, no pulmonary hypertension, mild PR.  Stress Testing:  Lexiscan/Modified Bruce protocol Tetrofosmin stress  test 07/28/2019:  Stress EKG is non-diagnostic, as this is pharmacological stress test. Additionally, patient exercised on the Modified Bruce protocol. Rest and stress EKG at 70% MPHR shows sinus rhythm, 34m horizontal ST depression in inferolateral leads.  Small area of mild intensity perfusion defect in inferior myocardium. Stress LVEF 64%. Low risk study.  Cardiac CTA 7/14: Ca score 684 (97th percentile for age and sex matched controls), possible > 50% calcific stenosis in the mid LAD and distal RCA.    Heart Catheterization: 7/17/14at [facility] per EMR:  dLM 40%, oLAD 50%, mLAD 50%, oCFX 30-40%, pRCA 30-50%, posterior AV segment ostial 50%, EF 60-70% (vigorous LV function).   LABORATORY DATA: External Labs: Collected: July 2020 Creatinine 1.31 mg/dL. eGFR: 54 mL/min per 1.73 m Lipid profile: Total cholesterol 125, triglycerides 106, HDL 39, LDL 67 Hemoglobin A1c:  5.6  External Labs: [facility] Creatinine 1.4 mg/dL. eGFR: 49 mL/min per 1.73 m Lipid profile: Total cholesterol 117, triglycerides 102, HDL 39, LDL 58 Hemoglobin A1c 5.3. TSH 2.28 AST 50, ALT 42, alk phos 89  IMPRESSION:    ICD-10-CM   1. Nonobstructive atherosclerosis of coronary artery  I25.10 EKG 12-Lead    metoprolol succinate (TOPROL-XL) 25 MG 24 hr tablet  2. Type 2 diabetes mellitus with other circulatory complication, without long-term current use of insulin (HCC)  E11.59   3. OSA (obstructive sleep apnea)  G47.33   4. Essential hypertension  I10   5. Former smoker  Z87.891     RECOMMENDATIONS: MLEWIE DEMANis a 74y.o. male whose  past medical history and cardiac risk factors include: Hypertension, hyperlipidemia, nonobstructive coronary artery disease, non-insulin-dependent diabetes mellitus type 2, sleep apnea on CPAP machine, former smoker, advanced age.  Nonobstructive coronary artery disease:  Currently denies any active chest pain at rest or with effort related activities.  Medications reconciled.  Toprol-XL refilled.  LDL currently at goal.  Hemoglobin A1c currently at goal.  Patient is compliant with his CPAP.   He continues to play tennis without any chest pain or anginal equivalents.  His overall functional status remains stable.  Most recent echo and MPI results reviewed and noted above for reference.   Non-insulin-dependent diabetes mellitus type 2: Hemoglobin A1c reviewed.  Currently managed per primary team.  Benign essential hypertension: Currently at goal.  Medications reviewed.  Medications Discontinued During This Encounter  Medication Reason  . metoprolol succinate (TOPROL-XL) 25 MG 24 hr tablet Reorder   Meds ordered this encounter  Medications  . metoprolol succinate (TOPROL-XL) 25 MG 24 hr tablet    Sig: Take 1 tablet (25 mg total) by mouth  in the morning. Take with or immediately following a meal.    Dispense:  180 tablet    Refill:  0    Current Outpatient Medications:  .  aspirin 81 MG tablet, Take 81 mg by mouth daily., Disp: , Rfl:  .  atorvastatin (LIPITOR) 20 MG tablet, Take 1 tablet (20 mg total) by mouth daily at 6 PM., Disp: 90 tablet, Rfl: 3 .  azelastine (ASTELIN) 137 MCG/SPRAY nasal spray, 1-2 puffs each nostril once or twice daily when needed, Disp: 30 mL, Rfl: prn .  cetirizine (ZYRTEC) 10 MG tablet, Take 10 mg by mouth daily., Disp: , Rfl:  .  finasteride (PROSCAR) 5 MG tablet, Take 5 mg by mouth daily., Disp: , Rfl:  .  fluticasone (FLONASE) 50 MCG/ACT nasal spray, 1-2 puffs  each nostril once daily at bedtime, Disp: 16 g, Rfl: prn .  hydrochlorothiazide (HYDRODIURIL) 25 MG tablet, Take 25 mg by mouth daily. , Disp: , Rfl:  .  IBUPROFEN PO, Take by mouth as needed., Disp: , Rfl:  .  Lactase (LACTOSE FAST ACTING RELIEF PO), Take 1 tablet by mouth 2 (two) times daily as needed. take with dairy products , Disp: , Rfl:  .  metFORMIN (GLUCOPHAGE) 500 MG tablet, Take 500 mg by mouth daily., Disp: , Rfl:  .  metoprolol succinate (TOPROL-XL) 25 MG 24 hr tablet, Take 1 tablet (25 mg total) by mouth in the morning. Take with or immediately following a meal., Disp: 180 tablet, Rfl: 0 .  Multiple Vitamin (MULTIVITAMIN) tablet, Take 1 tablet by mouth daily., Disp: , Rfl:  .  omeprazole (PRILOSEC) 20 MG capsule, Take 20 mg by mouth daily., Disp: , Rfl:  .  Tamsulosin HCl (FLOMAX) 0.4 MG CAPS, Take 0.4 mg by mouth 2 (two) times daily., Disp: , Rfl:   Orders Placed This Encounter  Procedures  . EKG 12-Lead   --Continue cardiac medications as reconciled in final medication list. --Return in 6 months (on 07/27/2020) for followup, CAD. Or sooner if needed. --Continue follow-up with your primary care physician regarding the management of your other chronic comorbid conditions.  Patient's questions and concerns were addressed to  his satisfaction. He voices understanding of the instructions provided during this encounter.   This note was created using a voice recognition software as a result there may be grammatical errors inadvertently enclosed that do not reflect the nature of this encounter. Every attempt is made to correct such errors.  Rex Kras, [place], [facility]  Pager: [phone] Office: [phone]

## 2020-02-02 ENCOUNTER — Ambulatory Visit: Payer: Medicare Other | Admitting: Cardiology

## 2020-02-10 ENCOUNTER — Ambulatory Visit (INDEPENDENT_AMBULATORY_CARE_PROVIDER_SITE_OTHER): Payer: Medicare Other | Admitting: Nurse Practitioner

## 2020-02-10 ENCOUNTER — Encounter: Payer: Self-pay | Admitting: Nurse Practitioner

## 2020-02-10 ENCOUNTER — Other Ambulatory Visit (INDEPENDENT_AMBULATORY_CARE_PROVIDER_SITE_OTHER): Payer: Medicare Other

## 2020-02-10 VITALS — BP 140/82 | HR 55 | Ht 67.0 in | Wt 187.0 lb

## 2020-02-10 DIAGNOSIS — R7989 Other specified abnormal findings of blood chemistry: Secondary | ICD-10-CM | POA: Diagnosis not present

## 2020-02-10 LAB — IGA: IgA: 111 mg/dL (ref 68–378)

## 2020-02-10 NOTE — Patient Instructions (Addendum)
If you are age 72 or older, your body mass index should be between 23-30. Your Body mass index is 29.29 kg/m. If this is out of the aforementioned range listed, please consider follow up with your Primary Care Provider.  If you are age 29 or younger, your body mass index should be between 19-25. Your Body mass index is 29.29 kg/m. If this is out of the aformentioned range listed, please consider follow up with your Primary Care Provider.   Your provider has requested that you go to the basement level for lab work before leaving today. Press "B" on the elevator. The lab is located at the first door on the left as you exit the elevator.  We will call you with the results of your lab.  Due to recent changes in healthcare laws, you may see the results of your imaging and laboratory studies on MyChart before your provider has had a chance to review them.  We understand that in some cases there may be results that are confusing or concerning to you. Not all laboratory results come back in the same time frame and the provider may be waiting for multiple results in order to interpret others.  Please give [place] 48 hours in order for your provider to thoroughly review all the results before contacting the office for clarification of your results.   Thank you for choosing [facility] Tye Savoy, NP  [phone]

## 2020-02-10 NOTE — Progress Notes (Signed)
Reviewed and agree with documentation and assessment and plan. K. Veena Tomaz Janis , MD   

## 2020-02-10 NOTE — Progress Notes (Signed)
ASSESSMENT AND PLAN    # Mild but chronically elevated liver enzymes.  --Most recent labs with AST of 52 / ALT 42.  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  --Will obtain labs to rule out other etiologies of chronic liver disease ( autoimmune, genetic, etc).  PCP has already gotten some of the necessary labs. So far, HBV negative, iron studies okay, normal TSH --We discussed management of non-alcoholic fatty liver disease. Fortunately patient is physically active. His diabetes seems to be under control. He doesn't drink much Etoh. Triglycerides are normal. Continued weight loss may help. We discussed low carb diet.  --Will call him with labs results and further recommendations.   # Colon cancer screening --Due for 10 year colonoscopy in 2023.   HISTORY OF PRESENT ILLNESS     Primary Gastroenterologist : Previously Dr. Deatra Ina Chief Complaint : abnormal liver tests  Harry Leach is a 74 y.o. male with PMH / [place] significant for,  but not necessarily limited to: diabetes, CAD, hyperlipidemia, HTN, hepatic steatosis, OSA.   Patient's LFTs were normal prior to 2019. Since then LFTs have been checked annually and found to be minimally elevated. Most recent AST of 52 / ALT of 42. RUQ [place] in July suggests hepatic steatosis.  He has no known [facility] of liver disease. Patient drinks 3 or less Bud Light beers a week. He takes Motrin 2-3 times a week. He is on a statin. He reports good glycemic control. He is physically active. Since being diagnosed with diabetes a few years ago he has lost ~ 25 pounds He has no physical complaints, feels well.   Data Reviewed:  PCP labs July 2021 HB surface Ag negative, HBcAb negative UIBC 250, 18 5 iron sat, ferr 215 TSH 2.28  11/18/19  /Alk phos 89, AST 49, ALT 40  11/24/19 alk phos 84, total bili 0.6, AST 52 (ref 7-45), ALT 42 ( ref 5-40)  12/10/2019 RUQ ultrasound --Probable hepatic  steatosis --Gallbladder folds versus septations  Patient brought in copies of older labs.  July 2020  AST 39 ( 10-35) / ALT 11 December 2017  AST 36    / ALT 07 December 2016  Normal LFTs July 2017  Normal LFTs   Previous Endoscopic Evaluations / Pertinent Studies:  September 2013 colonoscopy for heme positive stool --Complete exam with excellent prep --Diverticulosis --2 sessile polyps measuring 2 to 3 mm in size --Polyp pathology: Inflammatory type polyps --10-year follow-up colonoscopy recommended  Past Medical History:  Diagnosis Date  . Arthritis   . Coronary artery disease    a. Cardiac CTA 7/14: Ca score 684 (97th percentile for age and sex matched controls), possible > 50% calcific stenosis in the mid LAD and distal RCA. =>  LHC 11/28/12:  dLM 40%, oLAD 50%, mLAD 50%, oCFX 30-40%, pRCA 30-50%, posterior AV segment ostial 50%, EF 60-70% (vigorous LV function). Aggressive medical therapy was recommended.  . Diabetes mellitus without complication ([place])   . GERD (gastroesophageal reflux disease)   . Hyperlipidemia   . Hypertension    stress test 1999/ clearance Dr Nyoka Cowden on chart   EKG 6/12 on chart  . Sleep apnea    last study  2008- MODERATE  Dr Annamaria Boots sleep study EPIC, wears CPAP     Past Surgical History:  Procedure Laterality Date  . CARDIAC CATHETERIZATION    . COLONOSCOPY     x 3/   polypectomy x 1  .  FRACTURE SURGERY     arm  . KNEE ARTHROSCOPY     x 3    . TONSILLECTOMY    . TOTAL KNEE ARTHROPLASTY  07/12/2011   Procedure: TOTAL KNEE ARTHROPLASTY;  Surgeon: Gearlean Alf, MD;  Location: [facility];  Service: Orthopedics;  Laterality: Left;   Family History  Problem Relation Age of Onset  . Parkinsonism Mother   . Diabetes Mother   . Hypertension Mother   . Prostate cancer Father   . Heart disease Father   . Heart attack Father   . Cancer Father   . Heart failure Father   . Hypertension Father   . Colon cancer Neg Hx   . Esophageal cancer Neg Hx   . Stomach  cancer Neg Hx   . Rectal cancer Neg Hx    Social History   Tobacco Use  . Smoking status: Former Smoker    Packs/day: 0.25    Years: 10.00    Pack years: 2.50    Types: Cigarettes, Pipe    Quit date: 07/05/1991    Years since quitting: 28.6  . Smokeless tobacco: Never Used  . Tobacco comment: cigs in college,  Vaping Use  . Vaping Use: Never used  Substance Use Topics  . Alcohol use: Yes    Alcohol/week: 3.0 standard drinks    Types: 3 Cans of beer per week    Comment: 2-4 beers week  . Drug use: No   Current Outpatient Medications  Medication Sig Dispense Refill  . aspirin 81 MG tablet Take 81 mg by mouth daily.     Marland Kitchen atorvastatin (LIPITOR) 20 MG tablet Take 1 tablet (20 mg total) by mouth daily at 6 PM. 90 tablet 3  . azelastine (ASTELIN) 137 MCG/SPRAY nasal spray 1-2 puffs each nostril once or twice daily when needed 30 mL prn  . cetirizine (ZYRTEC) 10 MG tablet Take 10 mg by mouth daily.     . finasteride (PROSCAR) 5 MG tablet Take 5 mg by mouth daily.     . fluticasone (FLONASE) 50 MCG/ACT nasal spray 1-2 puffs each nostril once daily at bedtime 16 g prn  . hydrochlorothiazide (HYDRODIURIL) 25 MG tablet Take 25 mg by mouth daily.     . IBUPROFEN PO Take by mouth as needed.     . Lactase (LACTOSE FAST ACTING RELIEF PO) Take 1 tablet by mouth 2 (two) times daily as needed. take with dairy products     . metFORMIN (GLUCOPHAGE) 500 MG tablet Take 500 mg by mouth daily.     . Multiple Vitamin (MULTIVITAMIN) tablet Take 1 tablet by mouth daily.     Marland Kitchen omeprazole (PRILOSEC) 20 MG capsule Take 20 mg by mouth daily.     . Tamsulosin HCl (FLOMAX) 0.4 MG CAPS Take 0.4 mg by mouth 2 (two) times daily.     . metoprolol succinate (TOPROL-XL) 25 MG 24 hr tablet Take 1 tablet (25 mg total) by mouth in the morning. Take with or immediately following a meal. (Patient not taking: Reported on 02/10/2020) 180 tablet 0   No current facility-administered medications for this visit.   No Known  Allergies   Review of Systems: Positive for allergy / sinus trouble, arthritis. All other systems reviewed and negative except where noted in HPI.   PHYSICAL EXAM :    Wt Readings from Last 3 Encounters:  02/10/20 187 lb (84.8 kg)  01/28/20 187 lb (84.8 kg)  01/01/20 186 lb 6.4 oz (84.6 kg)  Ht _0  (1.702 m)   Wt 187 lb (84.8 kg)   BMI 29.29 kg/m  Constitutional:  Pleasant male in no acute distress. Psychiatric: Normal mood and affect. Behavior is normal. EENT: Pupils normal.  Conjunctivae are normal. No scleral icterus. Neck supple.  Cardiovascular: Normal rate, regular rhythm. No edema Pulmonary/chest: Effort normal and breath sounds normal. No wheezing, rales or rhonchi. Abdominal: Soft, nondistended, nontender. Bowel sounds active throughout. There are no masses palpable. No hepatomegaly. Neurological: Alert and oriented to person place and time. Skin: Skin is warm and dry. No rashes noted.  Tye Savoy, NP  02/10/2020, 10:26 AM  Cc:  Referring Provider Sueanne Margarita, DO

## 2020-02-13 LAB — ANA: Anti Nuclear Antibody (ANA): NEGATIVE

## 2020-02-13 LAB — HEPATITIS C ANTIBODY
Hepatitis C Ab: NONREACTIVE
SIGNAL TO CUT-OFF: 0.01 (ref <1.00)

## 2020-02-13 LAB — MITOCHONDRIAL ANTIBODIES: Mitochondrial M2 Ab, IgG: <=20 U

## 2020-02-13 LAB — ANTI-SMOOTH MUSCLE ANTIBODY, IGG: Actin (Smooth Muscle) Antibody (IGG): <20 U (ref <20)

## 2020-02-13 LAB — HEPATITIS A ANTIBODY, TOTAL: Hepatitis A AB,Total: NONREACTIVE

## 2020-02-17 ENCOUNTER — Other Ambulatory Visit: Payer: Self-pay | Admitting: General Surgery

## 2020-02-17 ENCOUNTER — Telehealth: Payer: Self-pay | Admitting: General Surgery

## 2020-02-17 DIAGNOSIS — R7989 Other specified abnormal findings of blood chemistry: Secondary | ICD-10-CM

## 2020-02-17 NOTE — Telephone Encounter (Signed)
Need the patient to come to [facility] lab for an additional test that I Did not place when he was here the other day. Left a message on his voicemail to call.

## 2020-02-18 NOTE — Telephone Encounter (Signed)
Pt returned your call and I gave him your message. Pt was not happy because he has to come back. If there is any specific instruction for this lab pls call pt to explain as I only told him that no appt was necessary and he just needs to come between 8 to 4 from [place] through Fri.

## 2020-02-19 ENCOUNTER — Other Ambulatory Visit: Payer: Medicare Other

## 2020-02-19 DIAGNOSIS — R7989 Other specified abnormal findings of blood chemistry: Secondary | ICD-10-CM

## 2020-02-20 LAB — TISSUE TRANSGLUTAMINASE, IGA: (tTG) Ab, IgA: <1 U/mL

## 2020-02-23 NOTE — Telephone Encounter (Signed)
Patient has came in and had labs.

## 2020-05-05 NOTE — Progress Notes (Signed)
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 Frequent nasal congestion bothers him, using Flonase, Azelastine. Sleeps on sides.  Had 2 Phizer Covax.  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 We changed to auto 10-15 for trial because of breakthrough centrals, with consideration of BIPAP if centrals seem important.  Download-compliance 100%, AHI 7.6/ hr Body weight today- 186 lbs Covid vax- 3 Phizer Flu vax-had Stable on cardiology f/u in September. Comfortable with CPAP. Few centrals on download- not a concern.Marland Kitchen  ROS-see HPI   + = positive Constitutional:   No-   weight loss, night sweats, fevers, chills, fatigue, lassitude. HEENT:   No-  headaches, difficulty swallowing, tooth/dental problems, sore throat,       No-  sneezing, itching, ear ache, + nasal congestion, +post nasal drip,  CV:  No-   chest pain, orthopnea, PND, swelling in lower extremities, anasarca, dizziness, palpitations Resp: No-   shortness of breath with exertion or at rest.              No-   productive cough,  No non-productive cough,  No- coughing up of blood.              No-   change in color of mucus.  No- wheezing.   Skin: No-   rash or lesions. GI:  No-   heartburn, indigestion, abdominal pain, nausea, vomiting,  GU:  MS:  No-   joint pain or swelling.  . Neuro-     nothing unusual Psych:  No- change in mood or affect. No depression or anxiety.  No memory  loss.  OBJ- Physical Exam General- Alert, Oriented, Affect-appropriate, Distress- none acute Skin- rash-none, lesions- none, excoriation- none Lymphadenopathy- none Head- atraumatic            Eyes- Gross vision intact, PERRLA, conjunctivae and secretions clear            Ears- Hearing, canals-normal            Nose- Clear, no-Septal dev, mucus, polyps, erosion, perforation             Throat- Mallampati IV , mucosa? thrush , drainage- none, tonsils- atrophic. Throat clearing again noted. Neck- flexible , trachea midline, no stridor , thyroid nl, carotid no bruit Chest - symmetrical excursion , unlabored           Heart/CV- RRR , no murmur , no gallop  , no rub, nl s1 s2                           - JVD- none , edema- none, stasis changes- none, varices- none, no carotid bruit heard           Lung- clear to P&A, wheeze- none, cough- none , dullness-none, rub- none           Chest wall-  Abd-  Br/ Gen/ Rectal- Not done, not indicated Extrem- cyanosis- none, clubbing, none, atrophy- none, strength- nl Neuro- grossly intact to observation

## 2020-05-06 ENCOUNTER — Ambulatory Visit (INDEPENDENT_AMBULATORY_CARE_PROVIDER_SITE_OTHER): Payer: Medicare Other | Admitting: Internal Medicine

## 2020-05-06 ENCOUNTER — Encounter: Payer: Self-pay | Admitting: Internal Medicine

## 2020-05-06 ENCOUNTER — Other Ambulatory Visit: Payer: Self-pay

## 2020-05-06 DIAGNOSIS — G4733 Obstructive sleep apnea (adult) (pediatric): Secondary | ICD-10-CM | POA: Diagnosis not present

## 2020-05-06 DIAGNOSIS — J302 Other seasonal allergic rhinitis: Secondary | ICD-10-CM | POA: Diagnosis not present

## 2020-05-06 DIAGNOSIS — J3089 Other allergic rhinitis: Secondary | ICD-10-CM | POA: Diagnosis not present

## 2020-05-21 ENCOUNTER — Other Ambulatory Visit: Payer: Medicare Other

## 2020-06-19 NOTE — Assessment & Plan Note (Signed)
Doing better. Not currently symptomatic. Can use Flonase or Nasalcrom per previous discussion.

## 2020-06-19 NOTE — Patient Instructions (Signed)
Continue CPAP auto 5-20  Please call as needed

## 2020-06-19 NOTE — Assessment & Plan Note (Signed)
Benefits from CPAP with good compliance and adequate control Plan- continue auto 5-20

## 2020-07-21 ENCOUNTER — Telehealth: Payer: Self-pay

## 2020-07-21 DIAGNOSIS — R7989 Other specified abnormal findings of blood chemistry: Secondary | ICD-10-CM

## 2020-07-21 NOTE — Telephone Encounter (Signed)
Lm on home vm for patient to return call to schedule appointment with Tye Savoy, NP or Dr. Silverio Decamp for a 6 month follow up.   Patient will need repeat labs a few days prior to appt.   Lab order in epic.

## 2020-07-21 NOTE — Telephone Encounter (Signed)
-----   Message from Greggory Keen, LPN sent at 02/72/5366  4:51 PM EDT ----- Schedule for [facility] appointment in April with LFT 2 days prior Dx is elevated LFT Patient of Dr Silverio Decamp and he saw PG

## 2020-07-22 NOTE — Telephone Encounter (Signed)
Spoke with patient, he has been scheduled for a 6 month follow up with Tye Savoy, NP on Monday, 08/16/20 at 10:30 AM. Patient is aware that he will need lab work a few days prior to his appt, he is aware that no appt is necessary and he can stop by at his convenience between 7:30 AM - 5 PM. Patient verbalized understanding and had no concerns at the end of the call.

## 2020-08-11 ENCOUNTER — Other Ambulatory Visit: Payer: Self-pay

## 2020-08-11 ENCOUNTER — Other Ambulatory Visit (INDEPENDENT_AMBULATORY_CARE_PROVIDER_SITE_OTHER): Payer: Medicare Other

## 2020-08-11 DIAGNOSIS — R7989 Other specified abnormal findings of blood chemistry: Secondary | ICD-10-CM

## 2020-08-11 LAB — HEPATIC FUNCTION PANEL
ALT: 25 U/L (ref 0–53)
AST: 36 U/L (ref 0–37)
Albumin: 4.5 g/dL (ref 3.5–5.2)
Alkaline Phosphatase: 70 U/L (ref 39–117)
Bilirubin, Direct: 0.2 mg/dL (ref 0.0–0.3)
Total Bilirubin: 0.9 mg/dL (ref 0.2–1.2)
Total Protein: 7.2 g/dL (ref 6.0–8.3)

## 2020-08-16 ENCOUNTER — Encounter: Payer: Self-pay | Admitting: Nurse Practitioner

## 2020-08-16 ENCOUNTER — Other Ambulatory Visit: Payer: Self-pay

## 2020-08-16 ENCOUNTER — Ambulatory Visit (INDEPENDENT_AMBULATORY_CARE_PROVIDER_SITE_OTHER): Payer: Medicare Other | Admitting: Nurse Practitioner

## 2020-08-16 ENCOUNTER — Other Ambulatory Visit: Payer: Medicare Other

## 2020-08-16 VITALS — BP 142/70 | HR 56 | Ht 65.0 in | Wt 188.2 lb

## 2020-08-16 DIAGNOSIS — R7989 Other specified abnormal findings of blood chemistry: Secondary | ICD-10-CM

## 2020-08-16 NOTE — Progress Notes (Signed)
ASSESSMENT AND PLAN    # 75 yo male with mild elevation in liver enzymes ( < 2x ULN), possibly secondary to steatosis suggested,  but not confirmed on [place]. Hepatic serologic workup for other etiologies was negative.  Follow up liver chemistries last week were normal .  --He will continue to work on maintaining normal BMI, good glycemic control. Triglycerides are normal.  --He consumes 3 beers or less a week --We discussed NSAID use and potential for liver injury. --HAV total ab negative. Will check HBV surface Ab today.  Recommend HAV vaccination and HBV vaccine it not immune. We can provide vaccinations or he can go through PCP --Will repeat liver chemistries in 6 months   # DM, on Metformin  # Colon cancer screening --Due for 10 year colonoscopy in 2023.   HISTORY OF PRESENT ILLNESS    Chief Complaint : Follow-up on abnormal liver test  LEVEN HOEL is a 75 y.o. male known to Dr. Silverio Decamp with a past medical history of diabetes, CAD, hyperlipidemia, HTN, possible hepatic steatosis by ultrasound, OSA  02/10/20 - office visit - I saw Ronalee Belts in late September for evaluation of abnormal liver enzymes which had been minimally elevated over the last couple of years despite losing approximately 25 pounds. His diabetes was reportedly under good control. He consumed no more than 3 beers a week. He was physical active. He took Motrin but no more than 2-3 times a week. His triglycerides were normal.   Ultrasound suggested hepatic steatosis.Marland Kitchen He was taking a statin   INTERVAL HISTORY: Hepatic serologic work-up for etiologies of chronic liver disease is negative. Follow-up liver enzymes on 08/11/2020 were normal despite any additional lifestyle changes / medication changes.  Today he has realized some loss in height making his BMI slightly higher than he thought. He has no GI complaints.   DATA REVIEWED: Labs by PCP Ferritin 215 TIBC 250, 18 % iron sat HBV s Ag negative  HBV C ab IgG  negative Component     Latest Ref Rng & Units 02/10/2020 02/19/2020  Hepatitis C Ab     NON-REACTI NON-REACTIVE   SIGNAL TO CUT-OFF     <1.00 0.01   Hepatitis A AB,Total     NON-REACTI NON-REACTIVE   Actin (Smooth Muscle) Antibody (IGG)     <20 U <20   Anti Nuclear Antibody (ANA)     NEGATIVE NEGATIVE   Mitochondrial M2 Ab, IgG     U < OR = 20.0   IgA     68 - 378 mg/dL 111   (tTG) Ab, IgA     U/mL  <1.0     Past Medical History:  Diagnosis Date  . Arthritis   . Coronary artery disease    a. Cardiac CTA 7/14: Ca score 684 (97th percentile for age and sex matched controls), possible > 50% calcific stenosis in the mid LAD and distal RCA. =>  LHC 11/28/12:  dLM 40%, oLAD 50%, mLAD 50%, oCFX 30-40%, pRCA 30-50%, posterior AV segment ostial 50%, EF 60-70% (vigorous LV function). Aggressive medical therapy was recommended.  . Diabetes mellitus without complication ([place])   . GERD (gastroesophageal reflux disease)   . Hyperlipidemia   . Hypertension    stress test 1999/ clearance Dr Nyoka Cowden on chart   EKG 6/12 on chart  . Sleep apnea    last study  2008- MODERATE  Dr Annamaria Boots sleep study EPIC, wears CPAP    Current Medications, Allergies, Past Surgical  History, Family History and Social History were reviewed in [employer] record.   Current Outpatient Medications  Medication Sig Dispense Refill  . aspirin 81 MG tablet Take 81 mg by mouth daily.     Marland Kitchen atorvastatin (LIPITOR) 20 MG tablet Take 1 tablet (20 mg total) by mouth daily at 6 PM. 90 tablet 3  . azelastine (ASTELIN) 137 MCG/SPRAY nasal spray 1-2 puffs each nostril once or twice daily when needed 30 mL prn  . cetirizine (ZYRTEC) 10 MG tablet Take 10 mg by mouth daily.     . finasteride (PROSCAR) 5 MG tablet Take 5 mg by mouth daily.     . fluticasone (FLONASE) 50 MCG/ACT nasal spray 1-2 puffs each nostril once daily at bedtime 16 g prn  . hydrochlorothiazide (HYDRODIURIL) 25 MG tablet Take 25 mg by mouth  daily.     . IBUPROFEN PO Take by mouth as needed.     . Lactase (LACTOSE FAST ACTING RELIEF PO) Take 1 tablet by mouth 2 (two) times daily as needed. take with dairy products     . metFORMIN (GLUCOPHAGE) 500 MG tablet Take 500 mg by mouth daily.     . Multiple Vitamin (MULTIVITAMIN) tablet Take 1 tablet by mouth daily.     Marland Kitchen omeprazole (PRILOSEC) 20 MG capsule Take 20 mg by mouth daily.     . Tamsulosin HCl (FLOMAX) 0.4 MG CAPS Take 0.4 mg by mouth 2 (two) times daily.     . metoprolol succinate (TOPROL-XL) 25 MG 24 hr tablet Take 1 tablet (25 mg total) by mouth in the morning. Take with or immediately following a meal. 180 tablet 0   No current facility-administered medications for this visit.    Review of Systems: Positive for increased flatulence . No chest pain. No shortness of breath. No urinary complaints.   PHYSICAL EXAM :    Wt Readings from Last 3 Encounters:  08/16/20 188 lb 4 oz (85.4 kg)  05/06/20 186 lb 12.8 oz (84.7 kg)  02/10/20 187 lb (84.8 kg)    BP (!) 142/70 (BP Location: Left Arm, Patient Position: Sitting, Cuff Size: Normal)   Pulse (!) 56   Ht 5\' 5"  (1.651 m)   Wt 188 lb 4 oz (85.4 kg)   BMI 31.33 kg/m  Constitutional:  Pleasant male in no acute distress. Psychiatric: Normal mood and affect. Behavior is normal. EENT: Pupils normal.  Conjunctivae are normal. No scleral icterus. Neck supple.  Cardiovascular: Normal rate, regular rhythm. No edema Pulmonary/chest: Effort normal and breath sounds normal. No wheezing, rales or rhonchi. Abdominal: Soft, nondistended, nontender. Bowel sounds active throughout. There are no masses palpable. No hepatomegaly. Neurological: Alert and oriented to person place and time. Skin: Skin is warm and dry. No rashes noted.  I spent 30 minutes total reviewing records, obtaining history, performing exam, counseling patient and documenting visit / findings.   Tye Savoy, NP  08/16/2020, 10:49 AM  Cc:  Sueanne Margarita,  DO

## 2020-08-16 NOTE — Patient Instructions (Addendum)
If you are age 73 or older, your body mass index should be between 23-30. Your Body mass index is 31.33 kg/m. If this is out of the aforementioned range listed, please consider follow up with your Primary Care Provider.  LABS:  Lab work has been ordered for you today. Our lab is located in the basement. Press "B" on the elevator. The lab is located at the first door on the left as you exit the elevator.  HEALTHCARE LAWS AND MY CHART RESULTS: Due to recent changes in healthcare laws, you may see the results of your imaging and laboratory studies on MyChart before your provider has had a chance to review them.   We understand that in some cases there may be results that are confusing or concerning to you. Not all laboratory results come back in the same time frame and the provider may be waiting for multiple results in order to interpret others.  Please give [place] 48 hours in order for your provider to thoroughly review all the results before contacting the office for clarification of your results.   It was great seeing you today! Thank you for entrusting me with your care and choosing [facility].  Tye Savoy, NP

## 2020-08-17 ENCOUNTER — Encounter: Payer: Self-pay | Admitting: Nurse Practitioner

## 2020-08-17 LAB — HEPATITIS B SURFACE ANTIBODY,QUALITATIVE: Hep B S Ab: NONREACTIVE

## 2020-08-27 ENCOUNTER — Other Ambulatory Visit: Payer: Self-pay | Admitting: Cardiology

## 2020-08-27 DIAGNOSIS — I2510 Atherosclerotic heart disease of native coronary artery without angina pectoris: Secondary | ICD-10-CM

## 2020-08-30 ENCOUNTER — Other Ambulatory Visit: Payer: Self-pay

## 2020-08-30 DIAGNOSIS — I2510 Atherosclerotic heart disease of native coronary artery without angina pectoris: Secondary | ICD-10-CM

## 2020-08-30 MED ORDER — METOPROLOL SUCCINATE ER 25 MG PO TB24: 25.0000 mg | ORAL_TABLET | Freq: Every morning | ORAL | 0 refills | Status: DC

## 2020-10-04 IMAGING — US US ABDOMEN LIMITED
1 series · 14 of 25 positions shown · non-contrast
Comparison: None.

CLINICAL DATA: Elevated liver function test.

EXAM:
ULTRASOUND ABDOMEN LIMITED RIGHT UPPER QUADRANT

[Series 1: us abdomen limited · 0.28mm/px · 14 of 53 slices shown]
[im 1/53]
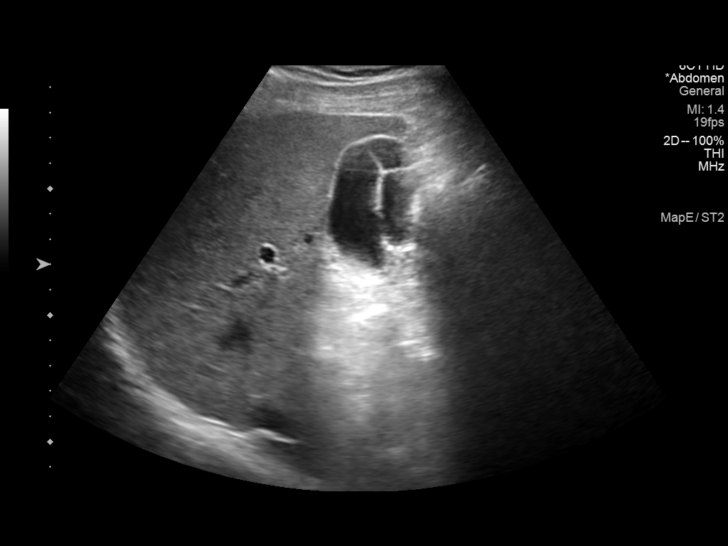
[im 5/53]
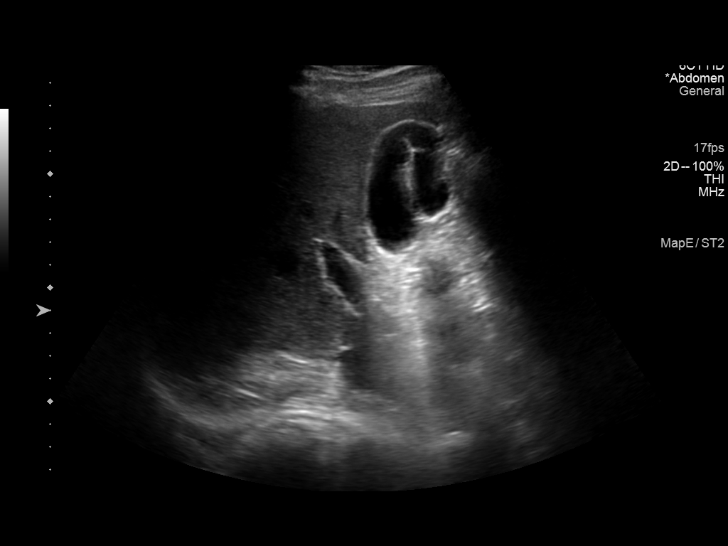
[im 9/53]
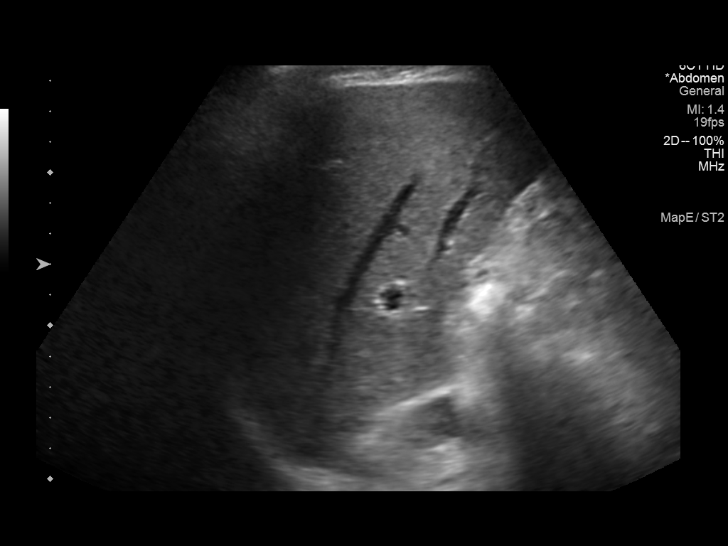
[im 14/53]
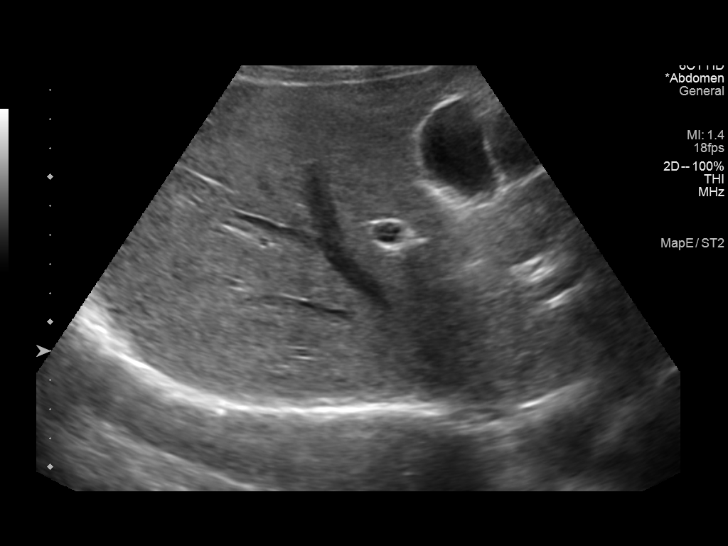
[im 18/53]
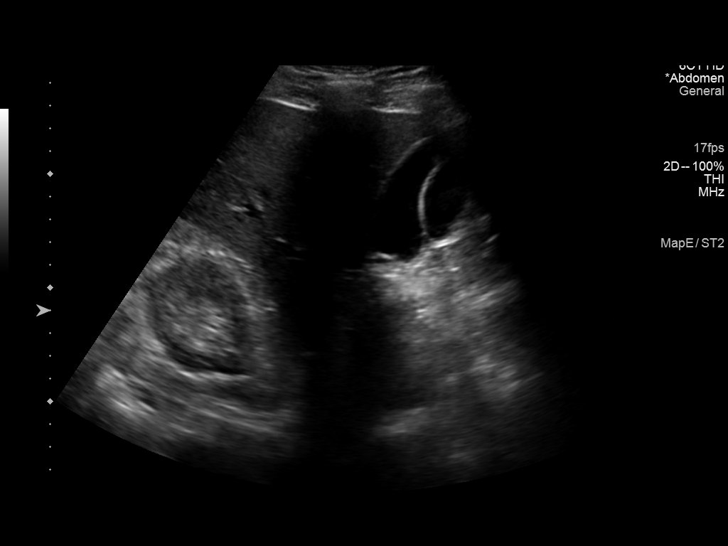
[im 20/53]
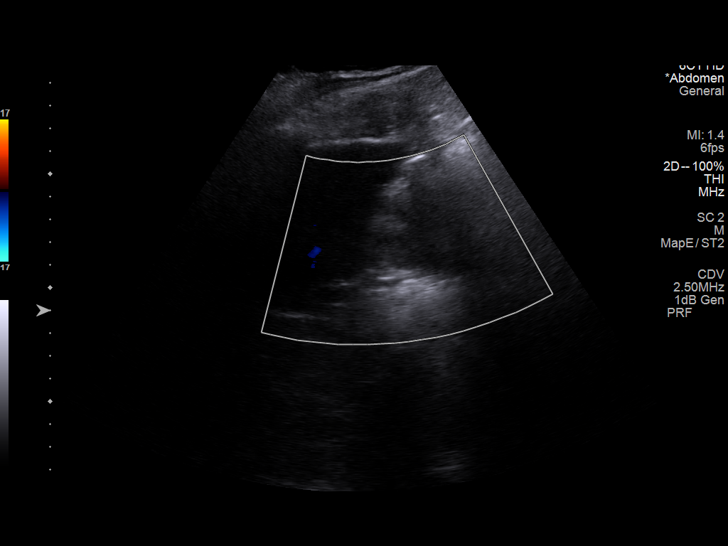
[im 24/53]
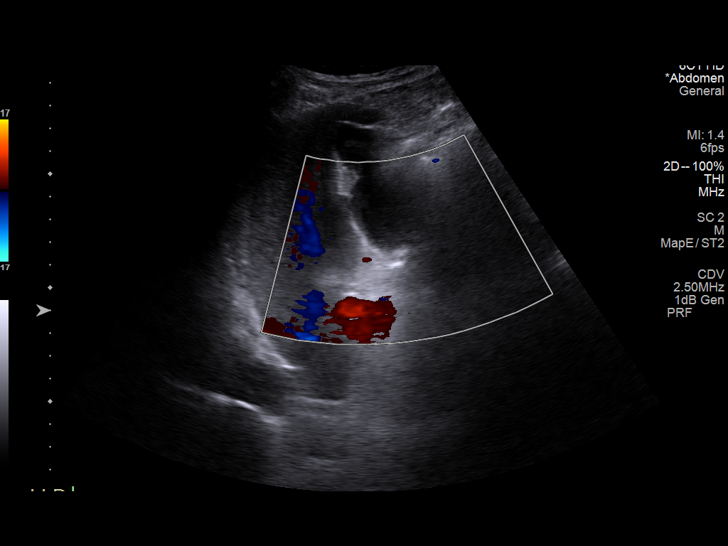
[im 29/53]
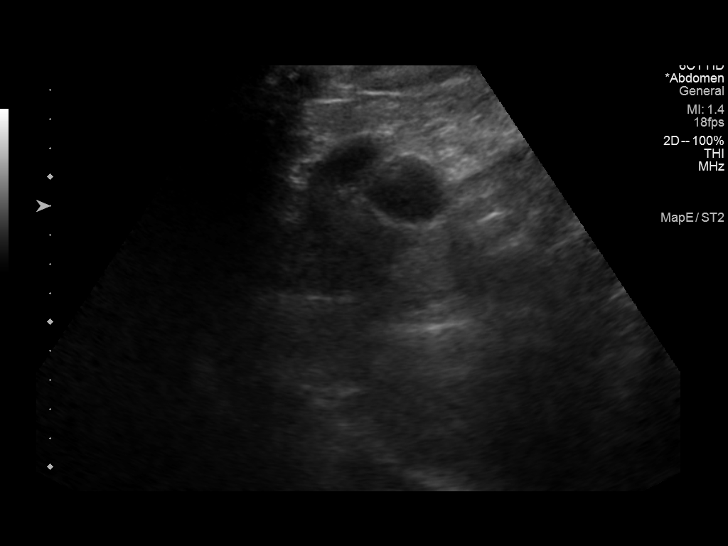
[im 33/53]
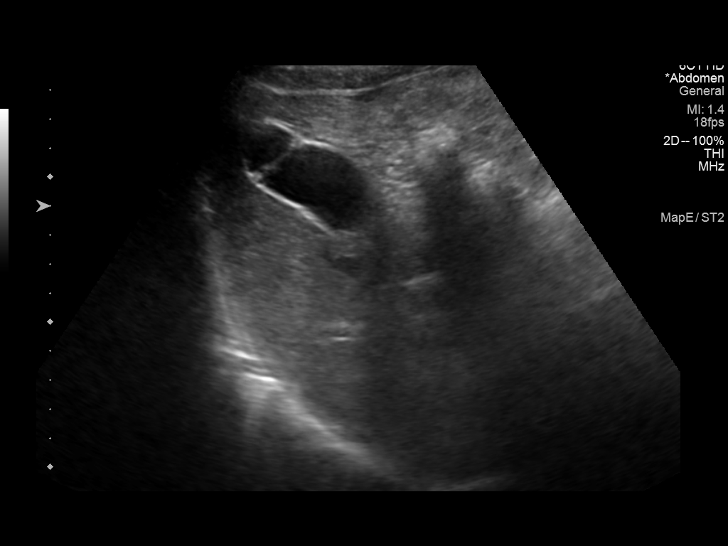
[im 35/53]
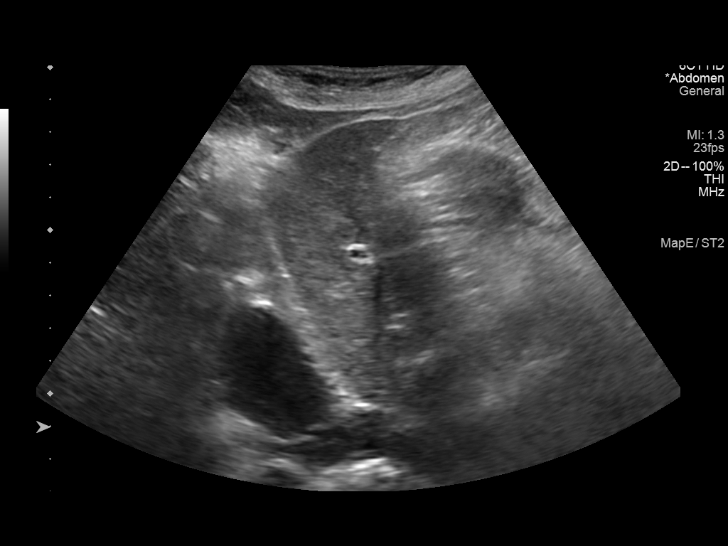
[im 40/53]
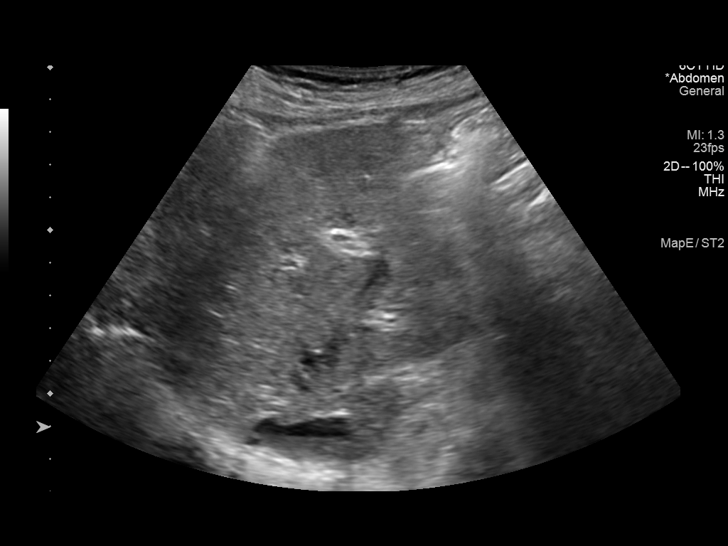
[im 44/53]
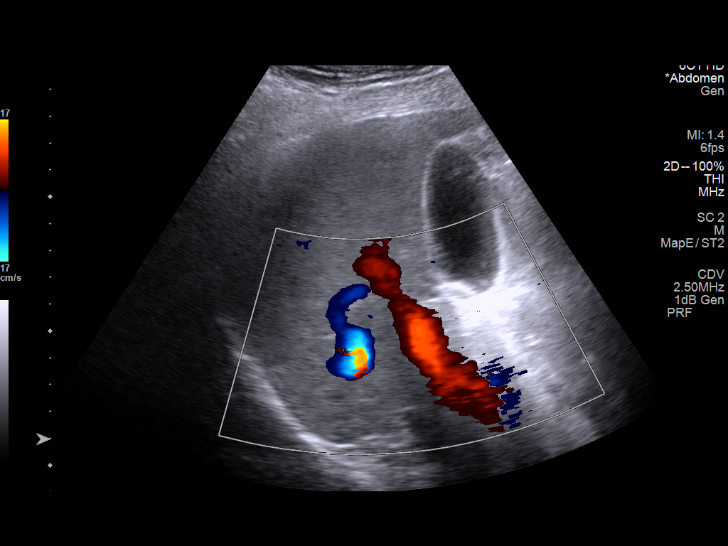
[im 48/53]
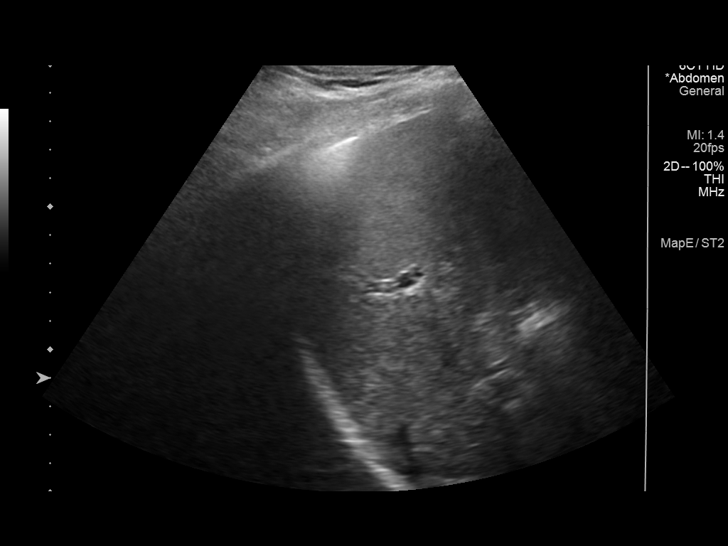
[im 53/53]
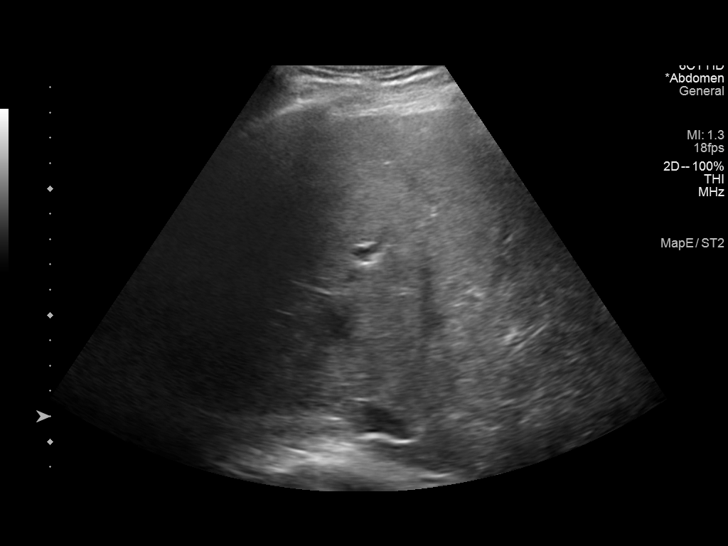

[14 of 25 positions shown; findings below may reference images not displayed]

FINDINGS: Gallbladder:

Folds in the gallbladder versus septations/synechia. No gallstones
or wall thickening visualized. No sonographic Murphy sign noted by
sonographer.

Common bile duct:

Diameter: 3-4 mm.

Liver:

No focal lesion identified. Heterogeneously increased in parenchymal
echogenicity. Portal vein is patent on color Doppler imaging with
normal direction of blood flow towards the liver.

Other: Technically difficult exam due to limited acoustic window and
bowel gas. No right upper quadrant ascites.
IMPRESSION: 1. Increased hepatic parenchymal echogenicity most consistent with
steatosis.
2. Gallbladder folds versus septations. No gallstones or sonographic
findings of acute cholecystitis. No evidence of biliary dilatation.

## 2021-01-27 ENCOUNTER — Encounter: Payer: Self-pay | Admitting: Cardiology

## 2021-01-27 ENCOUNTER — Ambulatory Visit: Payer: Medicare Other | Admitting: Cardiology

## 2021-01-27 ENCOUNTER — Other Ambulatory Visit: Payer: Self-pay

## 2021-01-27 VITALS — BP 138/84 | HR 53 | Temp 97.6°F | Resp 16 | Ht 65.0 in | Wt 182.6 lb

## 2021-01-27 DIAGNOSIS — G4733 Obstructive sleep apnea (adult) (pediatric): Secondary | ICD-10-CM

## 2021-01-27 DIAGNOSIS — E1159 Type 2 diabetes mellitus with other circulatory complications: Secondary | ICD-10-CM

## 2021-01-27 DIAGNOSIS — I2510 Atherosclerotic heart disease of native coronary artery without angina pectoris: Secondary | ICD-10-CM

## 2021-01-27 DIAGNOSIS — I10 Essential (primary) hypertension: Secondary | ICD-10-CM

## 2021-01-27 DIAGNOSIS — Z87891 Personal history of nicotine dependence: Secondary | ICD-10-CM

## 2021-01-27 NOTE — Progress Notes (Signed)
Date: 01/27/21 Last Office Visit: 01/28/2020  Chief Complaint  Patient presents with   Coronary Artery Disease   Follow-up    HPI  Harry Leach is a 75 y.o. male who presents to the office with a chief complaint of "1 year follow-up for CAD management." Patient's past medical history and cardiac risk factors include: Hypertension, hyperlipidemia, nonobstructive coronary artery disease, non-insulin-dependent diabetes mellitus type 2, sleep apnea on CPAP machine, former smoker, advanced age.  Coronary artery disease: Patient is here for 1 year follow-up for management of nonobstructive CAD.  Patient was noted to have a total coronary calcium score of 684 placing him at the 97th percentile back in July 2014.  He subsequently underwent left heart catheterization and was noted to have nonobstructive CAD and has been managed medically with regards to his symptoms and improving his modifiable cardiovascular risk factors.  His last ischemic evaluation was in March 2021 which noted a very small area of mild perfusion defect in the inferior myocardium.  However, since the patient is asymptomatic and continues to have good functional capacity for age the shared decision was to manage it medically.  He now presents for 1 year follow-up. From a cardiovascular standpoint patient remains a stable without any symptoms of chest pain or shortness of breath at rest or with effort related activities.  He continues to play tennis on a regular basis.  He has lost 6 pounds since last office visit for which he is congratulated for during today's encounter.  No hospitalizations or urgent care visits for cardiovascular symptoms over the last 1 year.  FUNCTIONAL STATUS: Plays tennis atleast once a week and during spring and summer months does yard work.   ALLERGIES: No Known Allergies  MEDICATION LIST PRIOR TO VISIT: Current Outpatient Medications on File Prior to Visit  Medication Sig Dispense Refill    aspirin 81 MG tablet Take 81 mg by mouth daily.      atorvastatin (LIPITOR) 20 MG tablet Take 1 tablet (20 mg total) by mouth daily at 6 PM. 90 tablet 3   azelastine (ASTELIN) 137 MCG/SPRAY nasal spray 1-2 puffs each nostril once or twice daily when needed 30 mL prn   cetirizine (ZYRTEC) 10 MG tablet Take 10 mg by mouth daily.      finasteride (PROSCAR) 5 MG tablet Take 5 mg by mouth daily.      fluticasone (FLONASE) 50 MCG/ACT nasal spray 1-2 puffs each nostril once daily at bedtime 16 g prn   hydrochlorothiazide (HYDRODIURIL) 25 MG tablet Take 25 mg by mouth daily.      IBUPROFEN PO Take by mouth as needed.      Lactase (LACTOSE FAST ACTING RELIEF PO) Take 1 tablet by mouth 2 (two) times daily as needed. take with dairy products      metFORMIN (GLUCOPHAGE) 500 MG tablet Take 500 mg by mouth daily.      metoprolol succinate (TOPROL-XL) 25 MG 24 hr tablet Take 1 tablet (25 mg total) by mouth in the morning. Take with or immediately following a meal. 180 tablet 0   Multiple Vitamin (MULTIVITAMIN) tablet Take 1 tablet by mouth daily.      omeprazole (PRILOSEC) 20 MG capsule Take 20 mg by mouth daily.      Tamsulosin HCl (FLOMAX) 0.4 MG CAPS Take 0.4 mg by mouth 2 (two) times daily.      No current facility-administered medications on file prior to visit.   PAST MEDICAL HISTORY: Past Medical History:  Diagnosis  Date   Arthritis    Coronary artery disease    a. Cardiac CTA 7/14: Ca score 684 (97th percentile for age and sex matched controls), possible > 50% calcific stenosis in the mid LAD and distal RCA. =>  LHC 11/28/12:  dLM 40%, oLAD 50%, mLAD 50%, oCFX 30-40%, pRCA 30-50%, posterior AV segment ostial 50%, EF 60-70% (vigorous LV function). Aggressive medical therapy was recommended.   Diabetes mellitus without complication (HCC)    GERD (gastroesophageal reflux disease)    Hyperlipidemia    Hypertension    stress test 1999/ clearance Dr Nyoka Cowden on chart   EKG 6/12 on chart   Sleep apnea     last study  2008- MODERATE  Dr Annamaria Boots sleep study EPIC, wears CPAP   PAST SURGICAL HISTORY: Past Surgical History:  Procedure Laterality Date   CARDIAC CATHETERIZATION     COLONOSCOPY     x 3/   polypectomy x 1   FRACTURE SURGERY     arm   KNEE ARTHROSCOPY     x 3     TONSILLECTOMY     TOTAL KNEE ARTHROPLASTY  07/12/2011   Procedure: TOTAL KNEE ARTHROPLASTY;  Surgeon: Gearlean Alf, MD;  Location: [facility];  Service: Orthopedics;  Laterality: Left;   FAMILY HISTORY: family history includes Cancer in his father; Diabetes in his mother; Heart attack in his father; Heart disease in his father; Heart failure in his father; Hypertension in his father and mother; Parkinsonism in his mother; Prostate cancer in his father.  SOCIAL HISTORY:  Social History   Tobacco Use   Smoking status: Former    Packs/day: 0.25    Years: 10.00    Pack years: 2.50    Types: Cigarettes, Pipe    Quit date: 07/05/1991    Years since quitting: 29.5   Smokeless tobacco: Never   Tobacco comments:    cigs in college,  Vaping Use   Vaping Use: Never used  Substance Use Topics   Alcohol use: Yes    Alcohol/week: 3.0 standard drinks    Types: 3 Cans of beer per week    Comment: 1 to 2 drinks a week   Drug use: No    Review of Systems  Constitutional: Negative for chills and fever.  HENT:  Negative for hoarse voice and nosebleeds.   Eyes:  Negative for discharge, double vision and pain.  Cardiovascular:  Negative for chest pain, claudication, dyspnea on exertion, leg swelling, near-syncope, orthopnea, palpitations, paroxysmal nocturnal dyspnea and syncope.  Respiratory:  Negative for hemoptysis and shortness of breath.   Musculoskeletal:  Negative for muscle cramps and myalgias.  Gastrointestinal:  Negative for abdominal pain, constipation, diarrhea, hematemesis, hematochezia, melena, nausea and vomiting.  Neurological:  Negative for dizziness and light-headedness.   PHYSICAL EXAM: Vitals with BMI  01/27/2021 08/16/2020 05/06/2020  Height _0  _1  _2   Weight 182 lbs 10 oz 188 lbs 4 oz 186 lbs 13 oz  BMI 30.39 74.94 49.67  Systolic 591 638 466  Diastolic 74 70 70  Pulse 53 56 57   CONSTITUTIONAL: Well-developed and well-nourished. No acute distress.  SKIN: Skin is warm and dry. No rash noted. No cyanosis. No pallor. No jaundice HEAD: Normocephalic and atraumatic.  EYES: No scleral icterus MOUTH/THROAT: Moist oral membranes.  NECK: No JVD present. No thyromegaly noted. No carotid bruits  LYMPHATIC: No visible cervical adenopathy.  CHEST Normal respiratory effort. No intercostal retractions  LUNGS: Clear to auscultation bilaterally.  No stridor. No  wheezes. No rales.  CARDIOVASCULAR: Regular rate and rhythm, positive S1-S2, no murmurs rubs or gallops appreciated. ABDOMINAL: Soft, nontender, nondistended, positive bowel sounds in all 4 quadrants.  No apparent ascites.  EXTREMITIES: No peripheral edema warm to touch bilaterally.  2+ dorsalis pedis and posterior tibial pulses bilaterally. HEMATOLOGIC: No significant bruising NEUROLOGIC: Oriented to person, place, and time. Nonfocal. Normal muscle tone.  PSYCHIATRIC: Normal mood and affect. Normal behavior. Cooperative  No change in physical examination since last office encounter.  CARDIAC DATABASE: EKG: 01/27/2021: Sinus bradycardia, 50 bpm, without underlying injury pattern.  Echocardiogram: 07/24/2019: LVEF 60-65%, septal basal hypertrophy, normal diastolic filling pattern, mild MR, mild TR, no pulmonary hypertension, mild PR.  Stress Testing:  Lexiscan/Modified Bruce protocol Tetrofosmin stress test 07/28/2019:  Stress EKG is non-diagnostic, as this is pharmacological stress test. Additionally, patient exercised on the Modified Bruce protocol. Rest and stress EKG at 70% MPHR shows sinus rhythm, 69mm horizontal ST depression in inferolateral leads.  Small area of mild intensity perfusion defect in inferior myocardium. Stress LVEF  64%. Low risk study.  Cardiac CTA 7/14: Ca score 684 (97th percentile for age and sex matched controls), possible > 50% calcific stenosis in the mid LAD and distal RCA.    Heart Catheterization: 7/17/14at [facility] per EMR:  dLM 40%, oLAD 50%, mLAD 50%, oCFX 30-40%, pRCA 30-50%, posterior AV segment ostial 50%, EF 60-70% (vigorous LV function).   LABORATORY DATA: External Labs: Collected: July 2020 Creatinine 1.31 mg/dL. eGFR: 54 mL/min per 1.73 m Lipid profile: Total cholesterol 125, triglycerides 106, HDL 39, LDL 67 Hemoglobin A1c: 5.6  IMPRESSION:    ICD-10-CM   1. Nonobstructive atherosclerosis of coronary artery  I25.10 EKG 12-Lead    2. Type 2 diabetes mellitus with other circulatory complication, without long-term current use of insulin (HCC)  E11.59     3. OSA (obstructive sleep apnea)  G47.33     4. Essential hypertension  I10     5. Former smoker  Z87.891       RECOMMENDATIONS: MALAKYE NOLDEN is a 75 y.o. male whose  past medical history and cardiac risk factors include: Hypertension, hyperlipidemia, nonobstructive coronary artery disease, non-insulin-dependent diabetes mellitus type 2, sleep apnea on CPAP machine, former smoker, advanced age.  Nonobstructive atherosclerosis of coronary artery EKG shows sinus mechanism without underlying injury pattern. Remains chest pain-free. Functional capacity remains preserved Has lost approximately 6 pounds due to lifestyle changes and healthier eating habits Has undergone recent ischemic evaluation as outlined above. I have asked him to bring in his yearly labs at the next office visit for reference and to update records. No hospitalizations or urgent care visits since last office encounter. Continue current medical therapy.  Essential hypertension Office blood pressures are within acceptable range. Medications reconciled. Low-salt diet recommended.  Type 2 diabetes mellitus with other circulatory complication,  without long-term current use of insulin ([place]) Educated on the importance of glycemic control and improving his other modifiable cardiovascular risk factors. Currently on statin therapy. Recommend either ACE inhibitor's or ARB given his diabetes and hypertension.  Will defer to primary team at this time  Former smoker Educated on the importance of continued smoking cessation.   There are no discontinued medications.  No orders of the defined types were placed in this encounter.   Current Outpatient Medications:    aspirin 81 MG tablet, Take 81 mg by mouth daily. , Disp: , Rfl:    atorvastatin (LIPITOR) 20 MG tablet, Take 1 tablet (20 mg total)  by mouth daily at 6 PM., Disp: 90 tablet, Rfl: 3   azelastine (ASTELIN) 137 MCG/SPRAY nasal spray, 1-2 puffs each nostril once or twice daily when needed, Disp: 30 mL, Rfl: prn   cetirizine (ZYRTEC) 10 MG tablet, Take 10 mg by mouth daily. , Disp: , Rfl:    finasteride (PROSCAR) 5 MG tablet, Take 5 mg by mouth daily. , Disp: , Rfl:    fluticasone (FLONASE) 50 MCG/ACT nasal spray, 1-2 puffs each nostril once daily at bedtime, Disp: 16 g, Rfl: prn   hydrochlorothiazide (HYDRODIURIL) 25 MG tablet, Take 25 mg by mouth daily. , Disp: , Rfl:    IBUPROFEN PO, Take by mouth as needed. , Disp: , Rfl:    Lactase (LACTOSE FAST ACTING RELIEF PO), Take 1 tablet by mouth 2 (two) times daily as needed. take with dairy products , Disp: , Rfl:    metFORMIN (GLUCOPHAGE) 500 MG tablet, Take 500 mg by mouth daily. , Disp: , Rfl:    metoprolol succinate (TOPROL-XL) 25 MG 24 hr tablet, Take 1 tablet (25 mg total) by mouth in the morning. Take with or immediately following a meal., Disp: 180 tablet, Rfl: 0   Multiple Vitamin (MULTIVITAMIN) tablet, Take 1 tablet by mouth daily. , Disp: , Rfl:    omeprazole (PRILOSEC) 20 MG capsule, Take 20 mg by mouth daily. , Disp: , Rfl:    Tamsulosin HCl (FLOMAX) 0.4 MG CAPS, Take 0.4 mg by mouth 2 (two) times daily. , Disp: , Rfl:    Orders Placed This Encounter  Procedures   EKG 12-Lead   --Continue cardiac medications as reconciled in final medication list. --No follow-ups on file. Or sooner if needed. --Continue follow-up with your primary care physician regarding the management of your other chronic comorbid conditions.  Patient's questions and concerns were addressed to his satisfaction. He voices understanding of the instructions provided during this encounter.   This note was created using a voice recognition software as a result there may be grammatical errors inadvertently enclosed that do not reflect the nature of this encounter. Every attempt is made to correct such errors.  Rex Kras, [place], [facility]  Pager: [phone] Office: [phone]

## 2021-02-02 ENCOUNTER — Telehealth: Payer: Self-pay

## 2021-02-02 NOTE — Telephone Encounter (Signed)
-----   Message from Hughie Closs, RN sent at 08/11/2020  3:36 PM EDT ----- Call pt. To come in for 6 month LFTs = 02/11/21

## 2021-02-02 NOTE — Telephone Encounter (Signed)
Orders entered for 6 mo repeat LFT's. Called pt to remind it is time to repeat labs. LVM requesting returned call.

## 2021-02-03 NOTE — Telephone Encounter (Signed)
SECOND ATTEMPT:  Called pt to remind about need for repeat labs. Unable to LVM with this attempt. Phone rang x3, connected but continued to "click" repeatedly before disconnecting. After being disconnected, attempted again with same outcome.

## 2021-02-07 NOTE — Telephone Encounter (Signed)
FINAL ATTEMPT:   Called pt to remind about need for repeat labs. LVM requesting returned call. Letter sent via My Chart as below:  Letter by Aleatha Borer, LPN on 3/75/4360     [facility] [place], [place]  [address] Phone:  [phone]   Fax:  [phone]     MRN: [id number] Harry Leach [place] [address]     Date: 02/07/2021   Dear [name],   We have been unable to reach you by telephone regarding the need for you to have repeat labs completed.  We have placed orders in our system and ask that you proceed to [place] of our office to have them completed at your earliest convenience. If you have any questions, respond to this message OR call our office at [phone].     Sincerely,   [facility]    [facility]                       Jeronimo Norma, [phone]                                 1

## 2021-02-17 ENCOUNTER — Other Ambulatory Visit (INDEPENDENT_AMBULATORY_CARE_PROVIDER_SITE_OTHER): Payer: Medicare Other

## 2021-02-17 DIAGNOSIS — R7989 Other specified abnormal findings of blood chemistry: Secondary | ICD-10-CM | POA: Diagnosis not present

## 2021-02-17 LAB — HEPATIC FUNCTION PANEL
ALT: 33 U/L (ref 0–53)
AST: 44 U/L — ABNORMAL HIGH (ref 0–37)
Albumin: 4.2 g/dL (ref 3.5–5.2)
Alkaline Phosphatase: 77 U/L (ref 39–117)
Bilirubin, Direct: 0.2 mg/dL (ref 0.0–0.3)
Total Bilirubin: 0.8 mg/dL (ref 0.2–1.2)
Total Protein: 6.4 g/dL (ref 6.0–8.3)

## 2021-02-21 ENCOUNTER — Telehealth: Payer: Self-pay

## 2021-02-21 NOTE — Telephone Encounter (Signed)
Left message for patient to please call back. 

## 2021-02-21 NOTE — Telephone Encounter (Signed)
-----   Message from Willia Craze, NP sent at 02/18/2021  5:57 PM EDT ----- Eustaquio Maize, please let patient know that his AST is minimally elevated and that enzymes is not even specific to the liver. He ALT ( liver specific) is normal as it was 6 months ago. Continue good glycemic control and try to maintain normal BMI to help with possible fatty liver disease. Unless Dr. Silverio Decamp has something to add, I don't think we need to keep monitoring liver tests   Should be fine to check them annually when he has his physical with PCP. Marland Kitchen Thanks

## 2021-02-28 ENCOUNTER — Other Ambulatory Visit: Payer: Self-pay | Admitting: Cardiology

## 2021-02-28 DIAGNOSIS — I2510 Atherosclerotic heart disease of native coronary artery without angina pectoris: Secondary | ICD-10-CM

## 2021-05-04 ENCOUNTER — Encounter: Payer: Self-pay | Admitting: Internal Medicine

## 2021-05-06 ENCOUNTER — Encounter: Payer: Self-pay | Admitting: Internal Medicine

## 2021-05-06 ENCOUNTER — Ambulatory Visit (INDEPENDENT_AMBULATORY_CARE_PROVIDER_SITE_OTHER): Payer: Medicare Other | Admitting: Internal Medicine

## 2021-05-06 ENCOUNTER — Other Ambulatory Visit: Payer: Self-pay

## 2021-05-06 DIAGNOSIS — G4733 Obstructive sleep apnea (adult) (pediatric): Secondary | ICD-10-CM

## 2021-05-06 DIAGNOSIS — I10 Essential (primary) hypertension: Secondary | ICD-10-CM | POA: Diagnosis not present

## 2021-05-06 NOTE — Progress Notes (Signed)
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 We changed to auto 10-15 for trial because of breakthrough centrals, with consideration of BIPAP if centrals seem important.  Download-compliance 100%, AHI 7.6/ hr Body weight today- 186 lbs Covid vax- 3 Phizer Flu vax-had Stable on cardiology f/u in September. Comfortable with CPAP. Few centrals on download- not a concern..  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  Comfortable CPAP with no concerns expressed.  Download reviewed with him. Had a recent cold but has been playing tennis with no change in his breathing overall and no major illness.  Does comment that his blood pressure has been running higher (140/80 on arrival today).   ROS-see HPI   + = positive Constitutional:   No-   weight loss, night sweats, fevers, chills, fatigue, lassitude. HEENT:   No-  headaches, difficulty swallowing, tooth/dental problems, sore throat,       No-  sneezing, itching, ear ache, + nasal congestion, +post nasal drip,  CV:  No-   chest pain, orthopnea, PND, swelling in lower extremities, anasarca, dizziness, palpitations Resp: No-   shortness of breath with exertion or at rest.              No-   productive cough,  No non-productive cough,  No- coughing up of blood.              No-   change in color of mucus.  No- wheezing.    Skin: No-   rash or lesions. GI:  No-   heartburn, indigestion, abdominal pain, nausea, vomiting,  GU:  MS:  No-   joint pain or swelling.  . Neuro-     nothing unusual Psych:  No- change in mood or affect. No depression or anxiety.  No memory loss.  OBJ- Physical Exam General- Alert, Oriented, Affect-appropriate, Distress- none acute Skin- rash-none, lesions- none, excoriation- none Lymphadenopathy- none Head- atraumatic            Eyes- Gross vision intact, PERRLA, conjunctivae and secretions clear            Ears- Hearing, canals-normal            Nose- Clear, no-Septal dev, mucus, polyps, erosion, perforation             Throat- Mallampati IV , mucosa , drainage- none, tonsils- atrophic. Throat clearing again noted. Neck- flexible , trachea midline, no stridor , thyroid nl, carotid no bruit Chest - symmetrical excursion , unlabored           Heart/CV- RRR , no murmur , no gallop  , no rub, nl s1 s2                           - JVD- none , edema- none, stasis changes- none, varices- none, no carotid bruit heard           Lung- clear to P&A, wheeze- none, cough- none , dullness-none, rub- none  Chest wall-  Abd-  Br/ Gen/ Rectal- Not done, not indicated Extrem- cyanosis- none, clubbing, none, atrophy- none, strength- nl Neuro- grossly intact to observation

## 2021-05-06 NOTE — Patient Instructions (Addendum)
Order- DME [facility]-  continue CPAP auto 5-20, please replace mask of choice, chin strap,   Please call if we can help  You can check with your primary doctor about short term intervention trials for your BP.

## 2021-05-26 ENCOUNTER — Encounter: Payer: Self-pay | Admitting: Internal Medicine

## 2021-05-26 NOTE — Assessment & Plan Note (Signed)
Benefits from CPAP with good compliance and control.  Comfort issues reviewed. Plan-continue auto 10-20

## 2021-05-26 NOTE — Assessment & Plan Note (Signed)
He reports this is been running higher and is being followed.

## 2022-01-13 ENCOUNTER — Encounter: Payer: Self-pay | Admitting: Gastroenterology

## 2022-01-25 ENCOUNTER — Encounter: Payer: Self-pay | Admitting: Cardiology

## 2022-01-25 ENCOUNTER — Ambulatory Visit: Payer: Medicare Other | Admitting: Cardiology

## 2022-01-25 DIAGNOSIS — Z87891 Personal history of nicotine dependence: Secondary | ICD-10-CM

## 2022-01-25 DIAGNOSIS — E1159 Type 2 diabetes mellitus with other circulatory complications: Secondary | ICD-10-CM

## 2022-01-25 DIAGNOSIS — I2510 Atherosclerotic heart disease of native coronary artery without angina pectoris: Secondary | ICD-10-CM

## 2022-01-25 DIAGNOSIS — G4733 Obstructive sleep apnea (adult) (pediatric): Secondary | ICD-10-CM

## 2022-01-25 DIAGNOSIS — I10 Essential (primary) hypertension: Secondary | ICD-10-CM

## 2022-01-25 NOTE — Progress Notes (Signed)
Harry Leach Date of Birth: April 12, 1946 MRN: [id number] Primary Care Provider:Skakle, Eliberto Ivory, DO  Date: 01/25/22 Last Office Visit: 01/27/2021  Chief Complaint  Patient presents with   Coronary Artery Disease   Follow-up    HPI  Harry Leach is a 76 y.o. y.o. male whose past medical history and cardiac risk factors include: Hypertension, hyperlipidemia, nonobstructive coronary artery disease, non-insulin-dependent diabetes mellitus type 2, sleep apnea on CPAP machine, former smoker, advanced age.  Patient presents today for 1 year follow-up visit given his history of severe coronary artery calcification which is noted back in July 2014 with a total CAC of 684, placing him at the 97th percentile.  He subsequently has undergone a heart catheterization which noted nonobstructive CAD which is currently being managed medically.  Over the last 1 year patient remains asymptomatic.  No hospitalizations or urgent care visits for cardiovascular symptoms.  He had labs with PCP in July 2023 and LDL, A1c, renal function are within acceptable limits.  He continues to play tennis on a regular basis 3-4 times a week doubles and has not had any exertional related symptoms or change in physical endurance.  FUNCTIONAL STATUS: Plays tennis atleast 3-4 a week and during spring and summer months does yard work.   ALLERGIES: No Known Allergies  MEDICATION LIST PRIOR TO VISIT: Current Outpatient Medications on File Prior to Visit  Medication Sig Dispense Refill   aspirin 81 MG tablet Take 81 mg by mouth daily.      atorvastatin (LIPITOR) 20 MG tablet Take 1 tablet (20 mg total) by mouth daily at 6 PM. 90 tablet 3   azelastine (ASTELIN) 137 MCG/SPRAY nasal spray 1-2 puffs each nostril once or twice daily when needed 30 mL prn   cetirizine (ZYRTEC) 10 MG tablet Take 10 mg by mouth as needed for allergies.     finasteride (PROSCAR) 5 MG tablet Take 5 mg by mouth daily.      fluticasone (FLONASE)  50 MCG/ACT nasal spray 1-2 puffs each nostril once daily at bedtime 16 g prn   hydrochlorothiazide (HYDRODIURIL) 25 MG tablet Take 25 mg by mouth daily.      IBUPROFEN PO Take by mouth as needed.      Lactase (LACTOSE FAST ACTING RELIEF PO) Take 1 tablet by mouth 2 (two) times daily as needed. take with dairy products      metFORMIN (GLUCOPHAGE) 500 MG tablet Take 500 mg by mouth daily.      metoprolol succinate (TOPROL-XL) 25 MG 24 hr tablet TAKE 1 TABLET IN THE MORNING, TAKE WITH OR IMMEDIATELY FOLLOWING A MEAL 180 tablet 3   Multiple Vitamin (MULTIVITAMIN) tablet Take 1 tablet by mouth daily.      olmesartan (BENICAR) 5 MG tablet Take 5 mg by mouth daily.     omeprazole (PRILOSEC) 20 MG capsule Take 20 mg by mouth daily.      Tamsulosin HCl (FLOMAX) 0.4 MG CAPS Take 0.4 mg by mouth 2 (two) times daily.      No current facility-administered medications on file prior to visit.   PAST MEDICAL HISTORY: Past Medical History:  Diagnosis Date   Arthritis    Coronary artery disease    a. Cardiac CTA 7/14: Ca score 684 (97th percentile for age and sex matched controls), possible > 50% calcific stenosis in the mid LAD and distal RCA. =>  LHC 11/28/12:  dLM 40%, oLAD 50%, mLAD 50%, oCFX 30-40%, pRCA 30-50%, posterior AV segment ostial 50%, EF 60-70% (vigorous  LV function). Aggressive medical therapy was recommended.   Diabetes mellitus without complication (HCC)    GERD (gastroesophageal reflux disease)    Hyperlipidemia    Hypertension    stress test 1999/ clearance Dr Nyoka Cowden on chart   EKG 6/12 on chart   Sleep apnea    last study  2008- MODERATE  Dr Annamaria Boots sleep study EPIC, wears CPAP   PAST SURGICAL HISTORY: Past Surgical History:  Procedure Laterality Date   CARDIAC CATHETERIZATION     COLONOSCOPY     x 3/   polypectomy x 1   FRACTURE SURGERY     arm   KNEE ARTHROSCOPY     x 3     TONSILLECTOMY     TOTAL KNEE ARTHROPLASTY  07/12/2011   Procedure: TOTAL KNEE ARTHROPLASTY;  Surgeon:  Gearlean Alf, MD;  Location: [facility];  Service: Orthopedics;  Laterality: Left;   FAMILY HISTORY: family history includes Diabetes in his mother; Heart attack in his father; Heart disease in his father; Heart failure in his father; Hypertension in his father and mother; Parkinsonism in his mother; Prostate cancer in his father.  SOCIAL HISTORY:  Social History   Tobacco Use   Smoking status: Former    Packs/day: 0.25    Years: 10.00    Total pack years: 2.50    Types: Cigarettes, Pipe    Quit date: 07/05/1991    Years since quitting: 30.6   Smokeless tobacco: Never   Tobacco comments:    cigs in college,  Vaping Use   Vaping Use: Never used  Substance Use Topics   Alcohol use: Yes    Alcohol/week: 3.0 standard drinks of alcohol    Types: 3 Cans of beer per week    Comment: 1 to 2 drinks a week   Drug use: No    Review of Systems  Cardiovascular:  Negative for chest pain, claudication, dyspnea on exertion, irregular heartbeat, leg swelling, near-syncope, orthopnea, palpitations, paroxysmal nocturnal dyspnea and syncope.  Respiratory:  Negative for shortness of breath.   Hematologic/Lymphatic: Negative for bleeding problem.  Musculoskeletal:  Negative for muscle cramps and myalgias.  Neurological:  Negative for dizziness and light-headedness.    PHYSICAL EXAM:    01/25/2022   10:20 AM 05/06/2021   11:15 AM 01/27/2021   10:45 AM  Vitals with BMI  Height $Remov'5\' 5"'jKpcGb$  $Remove'5\' 5"'fsELNsG$  $RemoveB'5\' 5"'BWSTBsBF$   Weight 180 lbs 180 lbs 13 oz 182 lbs 10 oz  BMI 29.95 10.25 85.27  Systolic 782 423 536  Diastolic 65 80 84  Pulse 46 65 53   Physical Exam  Constitutional: No distress.  Age appropriate, hemodynamically stable.   Neck: No JVD present.  Cardiovascular: Normal rate, regular rhythm, S1 normal, S2 normal, intact distal pulses and normal pulses. Exam reveals no gallop, no S3 and no S4.  No murmur heard. Pulmonary/Chest: Effort normal and breath sounds normal. No stridor. He has no wheezes. He has no  rales.  Abdominal: Soft. Bowel sounds are normal. He exhibits no distension. There is no abdominal tenderness.  Musculoskeletal:        General: No edema.     Cervical back: Neck supple.  Neurological: He is alert and oriented to person, place, and time. He has intact cranial nerves (2-12).  Skin: Skin is warm and moist.   CARDIAC DATABASE: EKG: 01/27/2021: Sinus bradycardia, 50 bpm, without underlying injury pattern. 01/25/22 Sinus bradycardia, 46 bpm, without underlying injury pattern or ischemia.  Echocardiogram: 07/24/2019: LVEF 60-65%, septal basal  hypertrophy, normal diastolic filling pattern, mild MR, mild TR, no pulmonary hypertension, mild PR.  Stress Testing:  Lexiscan/Modified Bruce protocol Tetrofosmin stress test 07/28/2019:  Stress EKG is non-diagnostic, as this is pharmacological stress test. Additionally, patient exercised on the Modified Bruce protocol. Rest and stress EKG at 70% MPHR shows sinus rhythm, 62mm horizontal ST depression in inferolateral leads.  Small area of mild intensity perfusion defect in inferior myocardium. Stress LVEF 64%. Low risk study.  Cardiac CTA 7/14: Ca score 684 (97th percentile for age and sex matched controls), possible > 50% calcific stenosis in the mid LAD and distal RCA.    Heart Catheterization: 7/17/14at [place] per EMR:  dLM 40%, oLAD 50%, mLAD 50%, oCFX 30-40%, pRCA 30-50%, posterior AV segment ostial 50%, EF 60-70% (vigorous LV function).   LABORATORY DATA: External Labs: Collected: July 2020 Creatinine 1.31 mg/dL. eGFR: 54 mL/min per 1.73 m Lipid profile: Total cholesterol 125, triglycerides 106, HDL 39, LDL 67 Hemoglobin A1c: 5.6  External Labs: Collected: 12/02/2021 Total cholesterol 110, triglycerides 53, HDL 47, LDL 52, non-HDL 63. Sodium 138, potassium 4.6, chloride 104, bicarb 35, BUN 22, creatinine 1.1. AST 37, ALT 29, alkaline phosphatase 76 Hemoglobin 15.1 g/dL, hematocrit 42.4%. TSH 2.5. A1c  5.5   IMPRESSION:    ICD-10-CM   1. Nonobstructive atherosclerosis of coronary artery  I25.10 EKG 12-Lead    PCV ECHOCARDIOGRAM COMPLETE    2. Essential hypertension  I10 PCV ECHOCARDIOGRAM COMPLETE    3. Type 2 diabetes mellitus with other circulatory complication, without long-term current use of insulin (HCC)  E11.59     4. OSA (obstructive sleep apnea)  G47.33     5. Former smoker  Z87.891        RECOMMENDATIONS: Harry Leach is a 76 y.o. male whose  past medical history and cardiac risk factors include: Hypertension, hyperlipidemia, nonobstructive coronary artery disease, non-insulin-dependent diabetes mellitus type 2, sleep apnea on CPAP machine, former smoker, advanced age.  Nonobstructive atherosclerosis of coronary artery EKG: Nonischemic.   Free of angina pectoris. Functional Capacity remains stable. Most recent labs dated 12/02/2021 independently reviewed.  LDL currently at goal. Continue current medical therapy including aspirin and statin therapy. We emphasized the importance of improving his modifiable cardiovascular risk factors. We will repeat echocardiogram prior to the next year as a 3-year follow-up study.  Essential hypertension Office blood pressures within acceptable range. Medications reconciled. No changes warranted at this time  Type 2 diabetes mellitus with other circulatory complication, without long-term current use of insulin ([place]) Most recent hemoglobin A1c well controlled. Currently on statin therapy. Currently only on hydrochlorothiazide for blood pressure management. I have asked him to discuss with PCP possibly considering changing it to a small combination pill of both ACE or ARB plus HCTZ given the fact that he is diabetic more for renal protection.  Will defer to PCP   Medications Discontinued During This Encounter  Medication Reason   omeprazole (PRILOSEC) 20 MG capsule Duplicate    No orders of the defined types were placed  in this encounter.   Current Outpatient Medications:    aspirin 81 MG tablet, Take 81 mg by mouth daily. , Disp: , Rfl:    atorvastatin (LIPITOR) 20 MG tablet, Take 1 tablet (20 mg total) by mouth daily at 6 PM., Disp: 90 tablet, Rfl: 3   azelastine (ASTELIN) 137 MCG/SPRAY nasal spray, 1-2 puffs each nostril once or twice daily when needed, Disp: 30 mL, Rfl: prn   cetirizine (ZYRTEC) 10 MG  tablet, Take 10 mg by mouth as needed for allergies., Disp: , Rfl:    finasteride (PROSCAR) 5 MG tablet, Take 5 mg by mouth daily. , Disp: , Rfl:    fluticasone (FLONASE) 50 MCG/ACT nasal spray, 1-2 puffs each nostril once daily at bedtime, Disp: 16 g, Rfl: prn   hydrochlorothiazide (HYDRODIURIL) 25 MG tablet, Take 25 mg by mouth daily. , Disp: , Rfl:    IBUPROFEN PO, Take by mouth as needed. , Disp: , Rfl:    Lactase (LACTOSE FAST ACTING RELIEF PO), Take 1 tablet by mouth 2 (two) times daily as needed. take with dairy products , Disp: , Rfl:    metFORMIN (GLUCOPHAGE) 500 MG tablet, Take 500 mg by mouth daily. , Disp: , Rfl:    metoprolol succinate (TOPROL-XL) 25 MG 24 hr tablet, TAKE 1 TABLET IN THE MORNING, TAKE WITH OR IMMEDIATELY FOLLOWING A MEAL, Disp: 180 tablet, Rfl: 3   Multiple Vitamin (MULTIVITAMIN) tablet, Take 1 tablet by mouth daily. , Disp: , Rfl:    olmesartan (BENICAR) 5 MG tablet, Take 5 mg by mouth daily., Disp: , Rfl:    omeprazole (PRILOSEC) 20 MG capsule, Take 20 mg by mouth daily. , Disp: , Rfl:    Tamsulosin HCl (FLOMAX) 0.4 MG CAPS, Take 0.4 mg by mouth 2 (two) times daily. , Disp: , Rfl:   Orders Placed This Encounter  Procedures   EKG 12-Lead   PCV ECHOCARDIOGRAM COMPLETE   --Continue cardiac medications as reconciled in final medication list. --Return in about 1 year (around 01/26/2023) for Follow up, CAD. Or sooner if needed. --Continue follow-up with your primary care physician regarding the management of your other chronic comorbid conditions.  Patient's questions and  concerns were addressed to his satisfaction. He voices understanding of the instructions provided during this encounter.   This note was created using a voice recognition software as a result there may be grammatical errors inadvertently enclosed that do not reflect the nature of this encounter. Every attempt is made to correct such errors.  Rex Kras, [place], [facility]  Pager: [phone] Office: [phone]

## 2022-01-31 ENCOUNTER — Encounter: Payer: Self-pay | Admitting: Gastroenterology

## 2022-02-12 ENCOUNTER — Encounter: Payer: Self-pay | Admitting: Cardiology

## 2022-02-16 ENCOUNTER — Ambulatory Visit (AMBULATORY_SURGERY_CENTER): Payer: Self-pay | Admitting: *Deleted

## 2022-02-16 VITALS — Ht 65.0 in | Wt 176.2 lb

## 2022-02-16 DIAGNOSIS — Z1211 Encounter for screening for malignant neoplasm of colon: Secondary | ICD-10-CM

## 2022-02-16 MED ORDER — NA SULFATE-K SULFATE-MG SULF 17.5-3.13-1.6 GM/177ML PO SOLN: 1.0000 | Freq: Once | ORAL | 0 refills | Status: AC

## 2022-02-16 NOTE — Progress Notes (Signed)
No egg or soy allergy known to patient  No issues known to pt with past sedation with any surgeries or procedures Patient denies ever being told they had issues or difficulty with intubation  No FH of Malignant Hyperthermia Pt is not on diet pills Pt is not on home 02  Pt is not on blood thinners  Pt denies issues with constipation  No A fib or A flutter Have any cardiac testing pending--NO Pt instructed to use Single[employer]com or GoodRx for a price reduction on prep   

## 2022-02-20 ENCOUNTER — Encounter: Payer: Self-pay | Admitting: Certified Registered Nurse Anesthetist

## 2022-02-28 ENCOUNTER — Encounter: Payer: Self-pay | Admitting: Gastroenterology

## 2022-02-28 ENCOUNTER — Ambulatory Visit (AMBULATORY_SURGERY_CENTER): Payer: Medicare Other | Admitting: Gastroenterology

## 2022-02-28 VITALS — BP 100/51 | HR 52 | Temp 96.6°F | Resp 15 | Ht 65.0 in | Wt 176.2 lb

## 2022-02-28 DIAGNOSIS — D122 Benign neoplasm of ascending colon: Secondary | ICD-10-CM

## 2022-02-28 DIAGNOSIS — D125 Benign neoplasm of sigmoid colon: Secondary | ICD-10-CM

## 2022-02-28 DIAGNOSIS — D123 Benign neoplasm of transverse colon: Secondary | ICD-10-CM

## 2022-02-28 DIAGNOSIS — Z1211 Encounter for screening for malignant neoplasm of colon: Secondary | ICD-10-CM

## 2022-02-28 MED ORDER — SODIUM CHLORIDE 0.9 % IV SOLN: 500.0000 mL | Freq: Once | INTRAVENOUS | Status: DC

## 2022-02-28 NOTE — Progress Notes (Signed)
[name] states no medical or surgical changes since previsit or office visit. 

## 2022-02-28 NOTE — Progress Notes (Signed)
Called to room to assist during endoscopic procedure.  Patient ID and intended procedure confirmed with present staff. Received instructions for my participation in the procedure from the performing physician.  

## 2022-02-28 NOTE — Op Note (Addendum)
[place] Patient Name: Harry Leach Procedure Date: 02/28/2022 11:17 AM MRN: [id number] Endoscopist: Mauri Pole , MD Age: 76 Referring MD:  Date of Birth: April 21, 1946 Gender: Male Account #: [id number] Procedure:                Colonoscopy Indications:              Screening for colorectal malignant neoplasm Medicines:                Monitored Anesthesia Care Procedure:                Pre-Anesthesia Assessment:                           - Prior to the procedure, a History and Physical                            was performed, and patient medications and                            allergies were reviewed. The patient's tolerance of                            previous anesthesia was also reviewed. The risks                            and benefits of the procedure and the sedation                            options and risks were discussed with the patient.                            All questions were answered, and informed consent                            was obtained. Prior Anticoagulants: The patient has                            taken no previous anticoagulant or antiplatelet                            agents. ASA Grade Assessment: III - A patient with                            severe systemic disease. After reviewing the risks                            and benefits, the patient was deemed in                            satisfactory condition to undergo the procedure.                           After obtaining informed consent, the colonoscope  was passed under direct vision. Throughout the                            procedure, the patient's blood pressure, pulse, and                            oxygen saturations were monitored continuously. The                            [id number]-HQ190L Colonoscope was introduced through the                            anus and advanced to the the cecum, identified by                             appendiceal orifice and ileocecal valve. The                            colonoscopy was performed without difficulty. The                            patient tolerated the procedure well. The quality                            of the bowel preparation was unsatisfactory and 30                            percent obscured. The ileocecal valve, appendiceal                            orifice, and rectum were photographed. Scope In: 11:28:35 AM Scope Out: 11:49:35 AM Scope Withdrawal Time: 0 hours 13 minutes 54 seconds  Total Procedure Duration: 0 hours 21 minutes 0 seconds  Findings:                 The perianal and digital rectal examinations were                            normal.                           A 2 mm polyp was found in the sigmoid colon. The                            polyp was sessile. The polyp was removed with a                            cold biopsy forceps. Resection and retrieval were                            complete.                           A 15 mm polyp was found in the transverse colon.  The polyp was semi-pedunculated. The polyp was                            removed with a hot snare. Resection and retrieval                            were complete.                           A 7 mm polyp was found in the ascending colon. The                            polyp was sessile. The polyp was removed with a                            cold snare. Resection and retrieval were complete.                           Scattered small and large-mouthed diverticula were                            found in the sigmoid colon.                           Non-bleeding external and internal hemorrhoids were                            found during retroflexion. The hemorrhoids were                            medium-sized. Complications:            No immediate complications. Estimated Blood Loss:     Estimated blood loss was minimal. Impression:               -  Preparation of the colon was unsatisfactory.                           - One 2 mm polyp in the sigmoid colon, removed with                            a cold biopsy forceps. Resected and retrieved.                           - One 15 mm polyp in the transverse colon, removed                            with a hot snare. Resected and retrieved.                           - One 7 mm polyp in the ascending colon, removed                            with a cold snare. Resected and retrieved.                           -  Diverticulosis in the sigmoid colon.                           - Non-bleeding external and internal hemorrhoids. Recommendation:           - Patient has a contact number available for                            emergencies. The signs and symptoms of potential                            delayed complications were discussed with the                            patient. Return to normal activities tomorrow.                            Written discharge instructions were provided to the                            patient.                           - Resume previous diet.                           - Continue present medications.                           - Await pathology results.                           - Repeat colonoscopy in 1 year because the bowel                            preparation was suboptimal.                           - For future colonoscopy the patient will require                            an extended preparation. If there are any                            questions, please contact the gastroenterologist. Mauri Pole, MD 02/28/2022 11:57:46 AM This report has been signed electronically. Addendum Number: 1   Addendum Date: 03/01/2022 12:56:29 PM      Repeat colonoscopy at next available appointment due to inadequate bowel       prep Mauri Pole, MD 03/01/2022 12:57:13 PM This report has been signed electronically.

## 2022-02-28 NOTE — Progress Notes (Unsigned)
Report given to PACU, vss 

## 2022-02-28 NOTE — Patient Instructions (Addendum)
Please read handouts provided. Continue present medications. Await pathology results. Repeat colonoscopy in 1 year.   YOU HAD AN ENDOSCOPIC PROCEDURE TODAY AT [facility]:   Refer to the procedure report that was given to you for any specific questions about what was found during the examination.  If the procedure report does not answer your questions, please call your gastroenterologist to clarify.  If you requested that your care partner not be given the details of your procedure findings, then the procedure report has been included in a sealed envelope for you to review at your convenience later.  YOU SHOULD EXPECT: Some feelings of bloating in the abdomen. Passage of more gas than usual.  Walking can help get rid of the air that was put into your GI tract during the procedure and reduce the bloating. If you had a lower endoscopy (such as a colonoscopy or flexible sigmoidoscopy) you may notice spotting of blood in your stool or on the toilet paper. If you underwent a bowel prep for your procedure, you may not have a normal bowel movement for a few days.  Please Note:  You might notice some irritation and congestion in your nose or some drainage.  This is from the oxygen used during your procedure.  There is no need for concern and it should clear up in a day or so.  SYMPTOMS TO REPORT IMMEDIATELY:  Following lower endoscopy (colonoscopy or flexible sigmoidoscopy):  Excessive amounts of blood in the stool  Significant tenderness or worsening of abdominal pains  Swelling of the abdomen that is new, acute  Fever of 100F or higher.  For urgent or emergent issues, a gastroenterologist can be reached at any hour by calling [phone]. Do not use MyChart messaging for urgent concerns.    DIET:  We do recommend a small meal at first, but then you may proceed to your regular diet.  Drink plenty of fluids but you should avoid alcoholic beverages for 24 hours.  ACTIVITY:  You  should plan to take it easy for the rest of today and you should NOT DRIVE or use heavy machinery until tomorrow (because of the sedation medicines used during the test).    FOLLOW UP: Our staff will call the number listed on your records the next business day following your procedure.  We will call around 7:15- 8:00 am to check on you and address any questions or concerns that you may have regarding the information given to you following your procedure. If we do not reach you, we will leave a message.     If any biopsies were taken you will be contacted by phone or by letter within the next 1-3 weeks.  Please call us at [phone] if you have not heard about the biopsies in 3 weeks.    SIGNATURES/CONFIDENTIALITY: You and/or your care partner have signed paperwork which will be entered into your electronic medical record.  These signatures attest to the fact that that the information above on your After Visit Summary has been reviewed and is understood.  Full responsibility of the confidentiality of this discharge information lies with you and/or your care-partner.

## 2022-02-28 NOTE — Progress Notes (Unsigned)
[facility] History and Physical   Primary Care Physician:  Sueanne Margarita, DO   Reason for Procedure:  Colorectal cancer screening  Plan:    Screening colonoscopy with possible interventions as needed     HPI: Harry Leach is a very pleasant 76 y.o. male here for screening colonoscopy. Denies any nausea, vomiting, abdominal pain, melena or bright red blood per rectum  The risks and benefits as well as alternatives of endoscopic procedure(s) have been discussed and reviewed. All questions answered. The patient agrees to proceed.    Past Medical History:  Diagnosis Date   Allergy    Arthritis    Coronary artery disease    a. Cardiac CTA 7/14: Ca score 684 (97th percentile for age and sex matched controls), possible > 50% calcific stenosis in the mid LAD and distal RCA. =>  LHC 11/28/12:  dLM 40%, oLAD 50%, mLAD 50%, oCFX 30-40%, pRCA 30-50%, posterior AV segment ostial 50%, EF 60-70% (vigorous LV function). Aggressive medical therapy was recommended.   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 Authorizing Provider  aspirin 81 MG tablet Take 81 mg by mouth daily.    Yes [provider]  atorvastatin (LIPITOR) 20 MG tablet Take 1  tablet (20 mg total) by mouth daily at 6 PM. 12/10/13  Yes Kathlen Mody, Nicki Reaper T, PA-C  azelastine (ASTELIN) 137 MCG/SPRAY nasal spray 1-2 puffs each nostril once or twice daily when needed 11/13/12  Yes Young, Clinton D, MD  cetirizine (ZYRTEC) 10 MG tablet Take 10 mg by mouth as needed for allergies.   Yes [provider]  finasteride (PROSCAR) 5 MG tablet Take 5 mg by mouth daily.    Yes [provider]  fluticasone (FLONASE) 50 MCG/ACT nasal spray 1-2 puffs each nostril once daily at bedtime Patient taking differently: as needed. 1-2 puffs each nostril once daily at bedtime 11/13/12  Yes Young, Clinton D, MD  hydrochlorothiazide (HYDRODIURIL) 25 MG tablet Take 25 mg by mouth daily.    Yes [provider]  IBUPROFEN PO Take by mouth as needed.    Yes [provider]  Lactase (LACTOSE FAST ACTING RELIEF PO) Take 1 tablet by mouth 2 (two) times daily as needed. take with dairy products    Yes [provider]  metFORMIN (GLUCOPHAGE) 500 MG tablet Take 500 mg by mouth daily.    Yes [provider]  metoprolol succinate (TOPROL-XL) 25 MG 24 hr tablet TAKE 1 TABLET IN THE MORNING, TAKE WITH OR IMMEDIATELY FOLLOWING A MEAL 02/28/21  Yes Terri Skains,  Sunit, DO  Multiple Vitamin (MULTIVITAMIN) tablet Take 1 tablet by mouth daily.    Yes [provider]  olmesartan (BENICAR) 5 MG tablet Take 5 mg by mouth daily. 12/02/21  Yes [provider]  omeprazole (PRILOSEC) 20 MG capsule Take 20 mg by mouth daily.    Yes [provider]  Tamsulosin HCl (FLOMAX) 0.4 MG CAPS Take 0.4 mg by mouth 2 (two) times daily.    Yes [provider]  amoxicillin (AMOXIL) 500 MG capsule SMARTSIG:4 Capsule(s) By Mouth Patient not taking: Reported on 02/16/2022 09/29/21   [provider]    Current Outpatient Medications  Medication Sig Dispense Refill   aspirin 81 MG tablet Take 81 mg by mouth daily.      atorvastatin (LIPITOR) 20 MG tablet Take 1  tablet (20 mg total) by mouth daily at 6 PM. 90 tablet 3   azelastine (ASTELIN) 137 MCG/SPRAY nasal spray 1-2 puffs each nostril once or twice daily when needed 30 mL prn   cetirizine (ZYRTEC) 10 MG tablet Take 10 mg by mouth as needed for allergies.     finasteride (PROSCAR) 5 MG tablet Take 5 mg by mouth daily.      fluticasone (FLONASE) 50 MCG/ACT nasal spray 1-2 puffs each nostril once daily at bedtime (Patient taking differently: as needed. 1-2 puffs each nostril once daily at bedtime) 16 g prn   hydrochlorothiazide (HYDRODIURIL) 25 MG tablet Take 25 mg by mouth daily.      IBUPROFEN PO Take by mouth as needed.      Lactase (LACTOSE FAST ACTING RELIEF PO) Take 1 tablet by mouth 2 (two) times daily as needed. take with dairy products      metFORMIN (GLUCOPHAGE) 500 MG tablet Take 500 mg by mouth daily.      metoprolol succinate (TOPROL-XL) 25 MG 24 hr tablet TAKE 1 TABLET IN THE MORNING, TAKE WITH OR IMMEDIATELY FOLLOWING A MEAL 180 tablet 3   Multiple Vitamin (MULTIVITAMIN) tablet Take 1 tablet by mouth daily.      olmesartan (BENICAR) 5 MG tablet Take 5 mg by mouth daily.     omeprazole (PRILOSEC) 20 MG capsule Take 20 mg by mouth daily.      Tamsulosin HCl (FLOMAX) 0.4 MG CAPS Take 0.4 mg by mouth 2 (two) times daily.      amoxicillin (AMOXIL) 500 MG capsule SMARTSIG:4 Capsule(s) By Mouth (Patient not taking: Reported on 02/16/2022)     Current Facility-Administered Medications  Medication Dose Route Frequency Provider Last Rate Last Admin   0.9 %  sodium chloride infusion  500 mL Intravenous Once Mauri Pole, MD        Allergies as of 02/28/2022 - Review Complete 02/28/2022  Allergen Reaction Noted   Other  02/16/2022    Family History  Problem Relation Age of Onset   Parkinsonism Mother    Diabetes Mother    Hypertension Mother    Colon polyps Father    Prostate cancer Father    Heart disease Father    Heart attack Father    Heart failure Father    Hypertension  Father    Colon cancer Neg Hx    Esophageal cancer Neg Hx    Stomach cancer Neg Hx    Rectal cancer Neg Hx    Crohn's disease Neg Hx    Ulcerative colitis Neg Hx     Social History   Socioeconomic History   Marital status: Married    Spouse name: Not on  file   Number of children: 2   Years of education: Not on file   Highest education level: Not on file  Occupational History    Employer: [employer]  Tobacco Use   Smoking status: Former    Packs/day: 0.25    Years: 10.00    Total pack years: 2.50    Types: Cigarettes, Pipe    Quit date: 07/05/1991    Years since quitting: 30.6    Passive exposure: Never   Smokeless tobacco: Never   Tobacco comments:    cigs in college,  Vaping Use   Vaping Use: Never used  Substance and Sexual Activity   Alcohol use: Yes    Alcohol/week: 3.0 standard drinks of alcohol    Types: 3 Cans of beer per week    Comment: 1 to 2 drinks a week   Drug use: No   Sexual activity: Not on file  Other Topics Concern   Not on file  Social History Narrative   Not on file   Social Determinants of Health   Financial Resource Strain: Not on file  Food Insecurity: Not on file  Transportation Needs: Not on file  Physical Activity: Not on file  Stress: Not on file  Social Connections: Not on file  Intimate Partner Violence: Not on file    Review of Systems:  All other review of systems negative except as mentioned in the HPI.  Physical Exam: Vital signs in last 24 hours: Blood Pressure (Abnormal) 144/66   Pulse (Abnormal) 51   Temperature (Abnormal) 96.6 F (35.9 C) (Temporal)   Height '5\' 5"'$  (1.651 m)   Weight 176 lb 3.2 oz (79.9 kg)   Oxygen Saturation 97%   Body Mass Index 29.32 kg/m  General:   Alert, NAD Lungs:  Clear .   Heart:  Regular rate and rhythm Abdomen:  Soft, nontender and nondistended. Neuro/Psych:  Alert and cooperative. Normal mood and affect. A and O x 3  Reviewed labs, radiology imaging, old records and  pertinent past GI work up  Patient is appropriate for planned procedure(s) and anesthesia in an ambulatory setting   K. Denzil Magnuson , MD [phone]

## 2022-03-01 ENCOUNTER — Telehealth: Payer: Self-pay

## 2022-03-01 NOTE — Telephone Encounter (Signed)
  Follow up Call-     02/28/2022   10:56 AM  Call back number  Post procedure Call Back phone  # [phone]  Permission to leave phone message Yes     Patient questions:  Do you have a fever, pain , or abdominal swelling? No. Pain Score  0 *  Have you tolerated food without any problems? Yes.    Have you been able to return to your normal activities? Yes.    Do you have any questions about your discharge instructions: Diet   No. Medications  No. Follow up visit  Yes.    Do you have questions or concerns about your Care? Yes.    Patient has concerns regarding the f/u colonoscopy in one year due to a suboptimal prep. He would like to know if Dr. Silverio Decamp feels it is sufficient to wait one year, has concerns about possible polyps that weren't seen due to the suboptiomal prep and whether or not Dr. Silverio Decamp feels it's OK to wait one year. He also states he has some soreness in his calf and wonders if that could be from the procedure. Otherwise he is eating OK, hasn't had any bleeding and has not had a bowel movement at this time. I told him that I will route a message to Dr. Silverio Decamp and get back with him. Patient verbalizes understanding.  Actions: * If pain score is 4 or above: No action needed, pain <4.

## 2022-03-01 NOTE — Telephone Encounter (Signed)
Spoke with pt; PV scheduled for 03-21-22 at 1030 and repeat colonoscopy is 04-11-22 at 2:30 pm.  He is concerned about insurance covering this procedure- I told him I will reach out to our one who do precerts to see if we can get that answer prior to his PV

## 2022-03-01 NOTE — Telephone Encounter (Signed)
Called patient and discussed his concerns.  Prep was inadequate, discussed the possibility of missed lesions.  He prefers to go ahead and proceed with colonoscopy sooner.  I explained to him that we will not know about insurance coverage until we schedule procedure and request approval Please schedule repeat colonoscopy at [facility] with RN previsit.  He will need extended bowel prep, he will drink an additional half bottle of the prep the evening of 2 days prior to procedure.  He can eat soft diet 2 days prior to the procedure and stays on clear liquid diet 1 day before the procedure.  Thank you

## 2022-03-02 NOTE — Telephone Encounter (Signed)
Per Aviva Kluver, she checked with pt's insurance and it will be covered d/t it being a medical necessity.  Informed pt of this.  No further questions at this time.

## 2022-03-18 ENCOUNTER — Encounter: Payer: Self-pay | Admitting: Gastroenterology

## 2022-03-21 ENCOUNTER — Ambulatory Visit (AMBULATORY_SURGERY_CENTER): Payer: Self-pay | Admitting: *Deleted

## 2022-03-21 VITALS — Ht 65.0 in | Wt 175.0 lb

## 2022-03-21 DIAGNOSIS — Z86010 Personal history of colonic polyps: Secondary | ICD-10-CM

## 2022-03-21 MED ORDER — PEG 3350-KCL-NA BICARB-NACL 420 G PO SOLR: 4000.0000 mL | Freq: Once | ORAL | 0 refills | Status: AC

## 2022-03-21 NOTE — Progress Notes (Signed)
[name] previsit is done over the phone and all paperwork (prep instructions, blank consent form to just read over) sent to patient.  [name] name and DOB verified at the beginning of the previsit.  Pt denies any difficulty with ambulating.    No egg or soy allergy known to patient  No issues known to pt with past sedation with any surgeries or procedures Patient denies ever being told they had issues or difficulty with intubation  No FH of Malignant Hyperthermia Pt is not on diet pills Pt is not on  home 02  Pt is not on blood thinners  Pt denies issues with constipation  Pt encouraged to use to use Singlecare or Goodrx to reduce cost 2 Golytely/Miralax prep given per D.O.  Patient's chart reviewed by Osvaldo Angst CNRA prior to previsit and patient appropriate for the [facility].  Previsit completed and red dot placed by patient's name on their procedure day (on provider's schedule).

## 2022-04-11 ENCOUNTER — Encounter: Payer: Medicare Other | Admitting: Gastroenterology

## 2022-04-23 ENCOUNTER — Encounter: Payer: Self-pay | Admitting: Certified Registered Nurse Anesthetist

## 2022-04-25 ENCOUNTER — Encounter: Payer: Self-pay | Admitting: Gastroenterology

## 2022-04-25 ENCOUNTER — Ambulatory Visit (AMBULATORY_SURGERY_CENTER): Payer: Medicare Other | Admitting: Gastroenterology

## 2022-04-25 VITALS — BP 117/48 | HR 53 | Temp 97.5°F | Resp 12 | Ht 65.0 in | Wt 175.0 lb

## 2022-04-25 DIAGNOSIS — D120 Benign neoplasm of cecum: Secondary | ICD-10-CM | POA: Diagnosis not present

## 2022-04-25 DIAGNOSIS — D125 Benign neoplasm of sigmoid colon: Secondary | ICD-10-CM

## 2022-04-25 DIAGNOSIS — Z86010 Personal history of colonic polyps: Secondary | ICD-10-CM

## 2022-04-25 DIAGNOSIS — D123 Benign neoplasm of transverse colon: Secondary | ICD-10-CM

## 2022-04-25 DIAGNOSIS — D122 Benign neoplasm of ascending colon: Secondary | ICD-10-CM | POA: Diagnosis not present

## 2022-04-25 DIAGNOSIS — D124 Benign neoplasm of descending colon: Secondary | ICD-10-CM | POA: Diagnosis not present

## 2022-04-25 DIAGNOSIS — Z09 Encounter for follow-up examination after completed treatment for conditions other than malignant neoplasm: Secondary | ICD-10-CM

## 2022-04-25 MED ORDER — SODIUM CHLORIDE 0.9 % IV SOLN: 500.0000 mL | Freq: Once | INTRAVENOUS | Status: DC

## 2022-04-25 NOTE — Op Note (Signed)
[facility] Patient Name: Harry Leach Procedure Date: 04/25/2022 2:58 PM MRN: [id number] Endoscopist: Mauri Pole , MD, [id number] Age: 76 Referring MD:  Date of Birth: [birth year] Gender: Male Account #: [id number] Procedure:                Colonoscopy Indications:              High risk colon cancer surveillance: Personal                            history of colonic polyps, High risk colon cancer                            surveillance: Personal history of adenoma (10 mm or                            greater in size), High risk colon cancer                            surveillance: Personal history of multiple (3 or                            more) adenomas Medicines:                Monitored Anesthesia Care Procedure:                Pre-Anesthesia Assessment:                           - Prior to the procedure, a History and Physical                            was performed, and patient medications and                            allergies were reviewed. The patient's tolerance of                            previous anesthesia was also reviewed. The risks                            and benefits of the procedure and the sedation                            options and risks were discussed with the patient.                            All questions were answered, and informed consent                            was obtained. Prior Anticoagulants: The patient has                            taken no anticoagulant or antiplatelet agents. ASA  Grade Assessment: III - A patient with severe                            systemic disease. After reviewing the risks and                            benefits, the patient was deemed in satisfactory                            condition to undergo the procedure.                           After obtaining informed consent, the colonoscope                            was passed under direct vision. Throughout  the                            procedure, the patient's blood pressure, pulse, and                            oxygen saturations were monitored continuously. The                            Olympus [id number]-[id number]) Colonoscope was                            introduced through the anus and advanced to the the                            cecum, identified by appendiceal orifice and                            ileocecal valve. The colonoscopy was performed                            without difficulty. The patient tolerated the                            procedure well. The quality of the bowel                            preparation was good. The ileocecal valve,                            appendiceal orifice, and rectum were photographed. Scope In: 3:02:24 PM Scope Out: 3:22:27 PM Scope Withdrawal Time: 0 hours 14 minutes 32 seconds  Total Procedure Duration: 0 hours 20 minutes 3 seconds  Findings:                 The perianal and digital rectal examinations were                            normal.  A 1 mm polyp was found in the ileocecal valve. The                            polyp was flat. The polyp was removed with a cold                            biopsy forceps. Resection and retrieval were                            complete.                           Three sessile polyps were found in the sigmoid                            colon, descending colon and ascending colon. The                            polyps were 3 to 10 mm in size. These polyps were                            removed with a cold snare. Resection and retrieval                            were complete.                           Scattered medium-mouthed diverticula were found in                            the sigmoid colon and ascending colon.                           Non-bleeding external and internal hemorrhoids were                            found during retroflexion. The hemorrhoids were                             medium-sized.                           The exam was otherwise without abnormality. Complications:            No immediate complications. Estimated Blood Loss:     Estimated blood loss was minimal. Impression:               - One 1 mm polyp at the ileocecal valve, removed                            with a cold biopsy forceps. Resected and retrieved.                           - Three 3 to 10 mm polyps in the sigmoid colon, in  the descending colon and in the ascending colon,                            removed with a cold snare. Resected and retrieved.                           - Diverticulosis in the sigmoid colon and in the                            ascending colon.                           - Non-bleeding external and internal hemorrhoids.                           - The examination was otherwise normal. Recommendation:           - Patient has a contact number available for                            emergencies. The signs and symptoms of potential                            delayed complications were discussed with the                            patient. Return to normal activities tomorrow.                            Written discharge instructions were provided to the                            patient.                           - Resume previous diet.                           - Continue present medications.                           - Await pathology results.                           - Repeat colonoscopy in 3 years for surveillance                            based on pathology results. Mauri Pole, MD 04/25/2022 3:37:39 PM This report has been signed electronically.

## 2022-04-25 NOTE — Progress Notes (Signed)
[facility] History and Physical   Primary Care Physician:  Sueanne Margarita, DO   Reason for Procedure:  History of adenomatous colon polyps  Plan:    Surveillance colonoscopy with possible interventions as needed     HPI: Harry Leach is a very pleasant 76 y.o. male here for surveillance colonoscopy. Denies any nausea, vomiting, abdominal pain, melena or bright red blood per rectum  The risks and benefits as well as alternatives of endoscopic procedure(s) have been discussed and reviewed. All questions answered. The patient agrees to proceed.    Past Medical History:  Diagnosis Date   Allergy    Arthritis    Coronary artery disease    a. Cardiac CTA 7/14: Ca score 684 (97th percentile for age and sex matched controls), possible > 50% calcific stenosis in the mid LAD and distal RCA. =>  LHC 11/28/12:  dLM 40%, oLAD 50%, mLAD 50%, oCFX 30-40%, pRCA 30-50%, posterior AV segment ostial 50%, EF 60-70% (vigorous LV function). Aggressive medical therapy was recommended.   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 Authorizing Provider  aspirin 81 MG tablet Take 81 mg by mouth daily.    Yes [provider]  atorvastatin (LIPITOR) 20 MG  tablet Take 1 tablet (20 mg total) by mouth daily at 6 PM. 12/10/13  Yes Kathlen Mody, Nicki Reaper T, PA-C  azelastine (ASTELIN) 137 MCG/SPRAY nasal spray 1-2 puffs each nostril once or twice daily when needed 11/13/12  Yes Young, Clinton D, MD  cetirizine (ZYRTEC) 10 MG tablet Take 10 mg by mouth as needed for allergies.   Yes [provider]  finasteride (PROSCAR) 5 MG tablet Take 5 mg by mouth daily.    Yes [provider]  fluticasone (FLONASE) 50 MCG/ACT nasal spray 1-2 puffs each nostril once daily at bedtime Patient taking differently: as needed. 1-2 puffs each nostril once daily at bedtime 11/13/12  Yes Young, Clinton D, MD  hydrochlorothiazide (HYDRODIURIL) 25 MG tablet Take 25 mg by mouth daily.    Yes [provider]  Lactase (LACTOSE FAST ACTING RELIEF PO) Take 1 tablet by mouth 2 (two) times daily as needed. take with dairy products    Yes [provider]  metFORMIN (GLUCOPHAGE) 500 MG tablet Take 500 mg by mouth daily.    Yes [provider]  metoprolol succinate (TOPROL-XL) 25 MG 24 hr tablet TAKE 1 TABLET IN THE MORNING, TAKE WITH OR IMMEDIATELY FOLLOWING A MEAL 02/28/21  Yes Tolia, Sunit, DO  Multiple Vitamin (MULTIVITAMIN) tablet Take 1 tablet by mouth daily.  Yes [provider]  olmesartan (BENICAR) 5 MG tablet Take 5 mg by mouth daily. 12/02/21  Yes [provider]  omeprazole (PRILOSEC) 20 MG capsule Take 20 mg by mouth daily.    Yes [provider]  Tamsulosin HCl (FLOMAX) 0.4 MG CAPS Take 0.4 mg by mouth 2 (two) times daily.    Yes [provider]  TRUE METRIX BLOOD GLUCOSE TEST test strip daily. 03/20/22  Yes [provider]  TRUEplus Lancets 28G MISC daily. 03/20/22  Yes [provider]  amoxicillin (AMOXIL) 500 MG capsule SMARTSIG:4 Capsule(s) By Mouth Patient not taking: Reported on 02/16/2022 09/29/21   [provider]  IBUPROFEN PO Take by mouth as needed.     [provider]    Current Outpatient Medications  Medication Sig Dispense Refill   aspirin 81 MG tablet Take 81 mg by mouth daily.      atorvastatin (LIPITOR) 20 MG tablet Take 1 tablet (20 mg total) by mouth daily at 6 PM. 90 tablet 3   azelastine (ASTELIN) 137 MCG/SPRAY nasal spray 1-2 puffs each nostril once or twice daily when needed 30 mL prn   cetirizine (ZYRTEC) 10 MG tablet Take 10 mg by mouth as needed for allergies.     finasteride (PROSCAR) 5 MG tablet Take 5 mg by mouth daily.      fluticasone (FLONASE) 50 MCG/ACT nasal spray 1-2 puffs each nostril once daily at bedtime (Patient taking differently: as needed. 1-2 puffs each nostril once daily at bedtime) 16 g prn   hydrochlorothiazide (HYDRODIURIL) 25 MG tablet Take 25 mg by mouth daily.      Lactase (LACTOSE FAST ACTING RELIEF PO) Take 1 tablet by mouth 2 (two) times daily as needed. take with dairy products      metFORMIN (GLUCOPHAGE) 500 MG tablet Take 500 mg by mouth daily.      metoprolol succinate (TOPROL-XL) 25 MG 24 hr tablet TAKE 1 TABLET IN THE MORNING, TAKE WITH OR IMMEDIATELY FOLLOWING A MEAL 180 tablet 3   Multiple Vitamin (MULTIVITAMIN) tablet Take 1 tablet by mouth daily.      olmesartan (BENICAR) 5 MG tablet Take 5 mg by mouth daily.     omeprazole (PRILOSEC) 20 MG capsule Take 20 mg by mouth daily.      Tamsulosin HCl (FLOMAX) 0.4 MG CAPS Take 0.4 mg by mouth 2 (two) times daily.      TRUE METRIX BLOOD GLUCOSE TEST test strip daily.     TRUEplus Lancets 28G MISC daily.     amoxicillin (AMOXIL) 500 MG capsule SMARTSIG:4 Capsule(s) By Mouth (Patient not taking: Reported on 02/16/2022)     IBUPROFEN PO Take by mouth as needed.      Current Facility-Administered Medications  Medication Dose Route Frequency Provider Last Rate Last Admin   0.9 %  sodium chloride infusion  500 mL Intravenous Once Dekota Shenk V, MD       0.9 %  sodium chloride infusion  500 mL Intravenous Once Mauri Pole, MD        Allergies as  of 04/25/2022 - Review Complete 04/25/2022  Allergen Reaction Noted   Other  02/16/2022    Family History  Problem Relation Age of Onset   Parkinsonism Mother    Diabetes Mother    Hypertension Mother    Colon polyps Father    Prostate cancer Father    Heart disease Father    Heart attack Father    Heart failure Father  Hypertension Father    Colon cancer Neg Hx    Esophageal cancer Neg Hx    Stomach cancer Neg Hx    Rectal cancer Neg Hx    Crohn's disease Neg Hx    Ulcerative colitis Neg Hx     Social History   Socioeconomic History   Marital status: Married    Spouse name: Not on file   Number of children: 2   Years of education: Not on file   Highest education level: Not on file  Occupational History    Employer: [employer]  Tobacco Use   Smoking status: Former    Packs/day: 0.25    Years: 10.00    Total pack years: 2.50    Types: Cigarettes, Pipe    Quit date: 07/05/1991    Years since quitting: 30.8    Passive exposure: Never   Smokeless tobacco: Never   Tobacco comments:    cigs in college,  Vaping Use   Vaping Use: Never used  Substance and Sexual Activity   Alcohol use: Yes    Alcohol/week: 3.0 standard drinks of alcohol    Types: 3 Cans of beer per week    Comment: 1 to 2 drinks a week   Drug use: No   Sexual activity: Not on file  Other Topics Concern   Not on file  Social History Narrative   Not on file   Social Determinants of Health   Financial Resource Strain: Not on file  Food Insecurity: Not on file  Transportation Needs: Not on file  Physical Activity: Not on file  Stress: Not on file  Social Connections: Not on file  Intimate Partner Violence: Not on file    Review of Systems:  All other review of systems negative except as mentioned in the HPI.  Physical Exam: Vital signs in last 24 hours: Blood Pressure (Abnormal) 133/58   Pulse (Abnormal) 58   Temperature (Abnormal) 97.5 F (36.4 C)   Height '5\' 5"'$  (1.651 m)    Weight 175 lb (79.4 kg)   Oxygen Saturation 99%   Body Mass Index 29.12 kg/m  General:   Alert, NAD Lungs:  Clear .   Heart:  Regular rate and rhythm Abdomen:  Soft, nontender and nondistended. Neuro/Psych:  Alert and cooperative. Normal mood and affect. A and O x 3  Reviewed labs, radiology imaging, old records and pertinent past GI work up  Patient is appropriate for planned procedure(s) and anesthesia in an ambulatory setting   K. Denzil Magnuson , MD [phone]

## 2022-04-25 NOTE — Patient Instructions (Signed)
HANDOUTS PROVIDED ON: POLYPS, DIVERTICULOSIS, & HEMORRHOIDS  The polyps removed today have been sent for pathology.  The results can take 1-3 weeks to receive.  When your next colonoscopy should occur will be based on the pathology results.    You may resume your previous diet and medication schedule.  Thank you for allowing us to care for you today!!!   YOU HAD AN ENDOSCOPIC PROCEDURE TODAY AT THE [facility]:   Refer to the procedure report that was given to you for any specific questions about what was found during the examination.  If the procedure report does not answer your questions, please call your gastroenterologist to clarify.  If you requested that your care partner not be given the details of your procedure findings, then the procedure report has been included in a sealed envelope for you to review at your convenience later.  YOU SHOULD EXPECT: Some feelings of bloating in the abdomen. Passage of more gas than usual.  Walking can help get rid of the air that was put into your GI tract during the procedure and reduce the bloating. If you had a lower endoscopy (such as a colonoscopy or flexible sigmoidoscopy) you may notice spotting of blood in your stool or on the toilet paper. If you underwent a bowel prep for your procedure, you may not have a normal bowel movement for a few days.  Please Note:  You might notice some irritation and congestion in your nose or some drainage.  This is from the oxygen used during your procedure.  There is no need for concern and it should clear up in a day or so.  SYMPTOMS TO REPORT IMMEDIATELY:  Following lower endoscopy (colonoscopy or flexible sigmoidoscopy):  Excessive amounts of blood in the stool  Significant tenderness or worsening of abdominal pains  Swelling of the abdomen that is new, acute  Fever of 100F or higher  For urgent or emergent issues, a gastroenterologist can be reached at any hour by calling [phone]. Do  not use MyChart messaging for urgent concerns.    DIET:  We do recommend a small meal at first, but then you may proceed to your regular diet.  Drink plenty of fluids but you should avoid alcoholic beverages for 24 hours.  ACTIVITY:  You should plan to take it easy for the rest of today and you should NOT DRIVE or use heavy machinery until tomorrow (because of the sedation medicines used during the test).    FOLLOW UP: Our staff will call the number listed on your records the next business day following your procedure.  We will call around 7:15- 8:00 am to check on you and address any questions or concerns that you may have regarding the information given to you following your procedure. If we do not reach you, we will leave a message.     If any biopsies were taken you will be contacted by phone or by letter within the next 1-3 weeks.  Please call us at [phone] if you have not heard about the biopsies in 3 weeks.    SIGNATURES/CONFIDENTIALITY: You and/or your care partner have signed paperwork which will be entered into your electronic medical record.  These signatures attest to the fact that that the information above on your After Visit Summary has been reviewed and is understood.  Full responsibility of the confidentiality of this discharge information lies with you and/or your care-partner.   

## 2022-04-25 NOTE — Progress Notes (Signed)
Report given to PACU, vss 

## 2022-04-25 NOTE — Progress Notes (Signed)
[name] states no medical or surgical changes since previsit or office visit. 

## 2022-04-26 ENCOUNTER — Telehealth: Payer: Self-pay | Admitting: *Deleted

## 2022-04-26 NOTE — Telephone Encounter (Signed)
Post procedure follow up call placed, no answer and left VM.  

## 2022-05-10 ENCOUNTER — Encounter: Payer: Self-pay | Admitting: Gastroenterology

## 2022-05-10 NOTE — Progress Notes (Signed)
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  Comfortable CPAP with no concerns expressed.  Download reviewed with him. Had a recent cold but has been playing tennis with no change in his breathing overall and no major illness.  Does comment that his blood pressure has been running higher (140/80 on arrival today).  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 Chin strap helps. Better off with CPAP. Otherwise breathing has been good. CXR 07/04/21- IMPRESSION:  Mild cardiomegaly.  No active lung disease.    ROS-see HPI   + = positive Constitutional:   No-   weight loss, night sweats, fevers, chills, fatigue, lassitude. HEENT:   No-  headaches, difficulty swallowing, tooth/dental problems, sore throat,       No-  sneezing, itching, ear ache, + nasal congestion, +post nasal drip,  CV:  No-   chest pain, orthopnea, PND, swelling in lower extremities, anasarca, dizziness, palpitations Resp: No-   shortness of breath with exertion or at rest.              No-   productive cough,  No non-productive cough,  No- coughing up of blood.              No-   change in color of mucus.  No- wheezing.   Skin: No-   rash or lesions. GI:  No-   heartburn, indigestion, abdominal pain, nausea,  vomiting,  GU:  MS:  No-   joint pain or swelling.  . Neuro-     nothing unusual Psych:  No- change in mood or affect. No depression or anxiety.  No memory loss.  OBJ- Physical Exam General- Alert, Oriented, Affect-appropriate, Distress- none acute Skin- rash-none, lesions- none, excoriation- none Lymphadenopathy- none Head- atraumatic            Eyes- Gross vision intact, PERRLA, conjunctivae and secretions clear            Ears- Hearing, canals-normal            Nose- Clear, no-Septal dev, mucus, polyps, erosion, perforation             Throat- Mallampati IV , mucosa , drainage- none, tonsils- atrophic. Throat clearing again noted. Neck- flexible , trachea midline, no stridor , thyroid nl, carotid no bruit Chest - symmetrical excursion , unlabored           Heart/CV- RRR , no murmur , no gallop  , no rub, nl s1 s2                           - JVD- none , edema- none, stasis changes- none, varices- none, no carotid bruit heard           Lung- clear to P&A, wheeze- none, cough- none , dullness-none, rub- none           Chest wall-  Abd-  Br/ Gen/ Rectal- Not done, not indicated Extrem- cyanosis- none, clubbing, none, atrophy- none, strength- nl Neuro- grossly intact to observation

## 2022-05-11 ENCOUNTER — Ambulatory Visit (INDEPENDENT_AMBULATORY_CARE_PROVIDER_SITE_OTHER): Payer: Medicare Other | Admitting: Internal Medicine

## 2022-05-11 ENCOUNTER — Encounter: Payer: Self-pay | Admitting: Internal Medicine

## 2022-05-11 VITALS — BP 124/68 | HR 73 | Ht 65.0 in | Wt 178.0 lb

## 2022-05-11 DIAGNOSIS — J302 Other seasonal allergic rhinitis: Secondary | ICD-10-CM | POA: Diagnosis not present

## 2022-05-11 DIAGNOSIS — J3089 Other allergic rhinitis: Secondary | ICD-10-CM

## 2022-05-11 DIAGNOSIS — G4733 Obstructive sleep apnea (adult) (pediatric): Secondary | ICD-10-CM

## 2022-05-11 MED ORDER — AZELASTINE HCL 0.1 % NA SOLN: NASAL | 99 refills | Status: DC

## 2022-05-11 NOTE — Patient Instructions (Signed)
We can continue CPAP auto 5-20  Azelastine refilled  Please call if we can help

## 2022-06-06 NOTE — Assessment & Plan Note (Signed)
Benefits from CPAP with good compliance and control Plan- continue auto 5-20

## 2022-06-06 NOTE — Assessment & Plan Note (Signed)
Nasal congestion is controlled and not interfering with CPAP use

## 2023-01-10 ENCOUNTER — Other Ambulatory Visit: Payer: Medicare Other

## 2023-01-12 ENCOUNTER — Other Ambulatory Visit: Payer: Medicare Other

## 2023-01-19 ENCOUNTER — Ambulatory Visit: Payer: Medicare Other

## 2023-01-19 DIAGNOSIS — I10 Essential (primary) hypertension: Secondary | ICD-10-CM

## 2023-01-19 DIAGNOSIS — I2510 Atherosclerotic heart disease of native coronary artery without angina pectoris: Secondary | ICD-10-CM

## 2023-01-26 ENCOUNTER — Ambulatory Visit: Payer: Medicare Other | Admitting: Cardiology

## 2023-01-26 ENCOUNTER — Encounter: Payer: Self-pay | Admitting: Cardiology

## 2023-01-26 VITALS — BP 138/78 | HR 52 | Resp 16 | Ht 65.0 in | Wt 179.0 lb

## 2023-01-26 DIAGNOSIS — I2510 Atherosclerotic heart disease of native coronary artery without angina pectoris: Secondary | ICD-10-CM

## 2023-01-26 DIAGNOSIS — G4733 Obstructive sleep apnea (adult) (pediatric): Secondary | ICD-10-CM

## 2023-01-26 DIAGNOSIS — I10 Essential (primary) hypertension: Secondary | ICD-10-CM

## 2023-01-26 DIAGNOSIS — E1159 Type 2 diabetes mellitus with other circulatory complications: Secondary | ICD-10-CM

## 2023-01-26 NOTE — Progress Notes (Signed)
Harry Leach Date of Birth: May 31, 1945 MRN: [id number] Primary Care Provider:Skakle, Eliberto Ivory, DO  Date: 01/26/23 Last Office Visit: 01/26/2023  Chief Complaint  Patient presents with   Nonobstructive atherosclerosis of coronary artery    HPI  Harry Leach is a 77 y.o. y.o. male whose past medical history and cardiac risk factors include: Hypertension, hyperlipidemia, nonobstructive coronary artery disease, non-insulin-dependent diabetes mellitus type 2, sleep apnea on CPAP machine, former smoker, advanced age.  Patient is being followed in the practice given his coronary artery calcification and nonobstructive CAD.  Over the last 1 year he denies anginal chest pain or heart failure symptoms.  No hospitalizations or urgent care visits for cardiovascular reasons.  Recently had labs with PCP in August 2024 which were independently reviewed.  Most recent echo from September 2024 reviewed independently with him in detail at today's visit  No change in overall physical endurance.  He still continues to play tennis on a regular basis 3-4 times a week.  FUNCTIONAL STATUS: Plays tennis atleast 3-4 a week and during spring and summer months does yard work.   ALLERGIES: Allergies  Allergen Reactions   Other     GRASS,RAG WEED    MEDICATION LIST PRIOR TO VISIT: Current Outpatient Medications on File Prior to Visit  Medication Sig Dispense Refill   aspirin 81 MG tablet Take 81 mg by mouth daily.      atorvastatin (LIPITOR) 20 MG tablet Take 1 tablet (20 mg total) by mouth daily at 6 PM. 90 tablet 3   azelastine (ASTELIN) 0.1 % nasal spray 1-2 puffs each nostril once or twice daily when needed 90 mL prn   cetirizine (ZYRTEC) 10 MG tablet Take 10 mg by mouth as needed for allergies.     finasteride (PROSCAR) 5 MG tablet Take 5 mg by mouth daily.      fluticasone (FLONASE) 50 MCG/ACT nasal spray 1-2 puffs each nostril once daily at bedtime (Patient taking differently: as needed.  1-2 puffs each nostril once daily at bedtime) 16 g prn   hydrochlorothiazide (HYDRODIURIL) 25 MG tablet Take 25 mg by mouth daily.      IBUPROFEN PO Take by mouth as needed.      Lactase (LACTOSE FAST ACTING RELIEF PO) Take 1 tablet by mouth 2 (two) times daily as needed. take with dairy products      metFORMIN (GLUCOPHAGE) 500 MG tablet Take 500 mg by mouth daily.      metoprolol succinate (TOPROL-XL) 25 MG 24 hr tablet TAKE 1 TABLET IN THE MORNING, TAKE WITH OR IMMEDIATELY FOLLOWING A MEAL 180 tablet 3   Multiple Vitamin (MULTIVITAMIN) tablet Take 1 tablet by mouth daily.      olmesartan (BENICAR) 5 MG tablet Take 5 mg by mouth daily.     omeprazole (PRILOSEC) 20 MG capsule Take 20 mg by mouth daily.      Tamsulosin HCl (FLOMAX) 0.4 MG CAPS Take 0.4 mg by mouth 2 (two) times daily.      TRUE METRIX BLOOD GLUCOSE TEST test strip daily.     TRUEplus Lancets 28G MISC daily.     No current facility-administered medications on file prior to visit.   PAST MEDICAL HISTORY: Past Medical History:  Diagnosis Date   Allergy    Arthritis    Coronary artery disease    a. Cardiac CTA 7/14: Ca score 684 (97th percentile for age and sex matched controls), possible > 50% calcific stenosis in the mid LAD and distal RCA. =>  LHC 11/28/12:  dLM 40%, oLAD 50%, mLAD 50%, oCFX 30-40%, pRCA 30-50%, posterior AV segment ostial 50%, EF 60-70% (vigorous LV function). Aggressive medical therapy was recommended.   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  SOCIAL HISTORY:  Social History   Tobacco Use   Smoking status: Former    Current packs/day: 0.00    Average packs/day: 0.3 packs/day for 10.0 years (2.5 ttl pk-yrs)    Types: Cigarettes, Pipe    Start date: 07/04/1981    Quit date: 07/05/1991    Years since quitting: 31.5    Passive exposure: Never   Smokeless tobacco: Never   Tobacco comments:    cigs in college,  Vaping Use   Vaping status: Never Used  Substance Use Topics   Alcohol use: Yes    Alcohol/week: 3.0 standard drinks of alcohol    Types: 3 Cans of beer per week    Comment: 1 to 2 drinks a week   Drug use: No    Review of Systems  Cardiovascular:  Negative for chest pain, claudication, dyspnea on exertion, irregular heartbeat, leg swelling, near-syncope, orthopnea, palpitations, paroxysmal nocturnal dyspnea and syncope.  Respiratory:  Negative for shortness of breath.   Hematologic/Lymphatic: Negative for bleeding problem.  Musculoskeletal:  Negative for muscle cramps and myalgias.  Neurological:  Negative for dizziness and light-headedness.    PHYSICAL EXAM:    01/26/2023   10:29 AM 01/26/2023    9:58 AM 05/11/2022   11:03 AM  Vitals with BMI  Height  5\' 5"  5\' 5"   Weight  179 lbs 178 lbs  BMI  29.79 29.62  Systolic 138 152 161  Diastolic 78 73 68  Pulse 52 48 73   Physical Exam  Constitutional: No distress.  Age appropriate, hemodynamically stable.   Neck: No JVD present.  Cardiovascular: Normal rate, regular  rhythm, S1 normal, S2 normal, intact distal pulses and normal pulses. Exam reveals no gallop, no S3 and no S4.  No murmur heard. Pulmonary/Chest: Effort normal and breath sounds normal. No stridor. He has no wheezes. He has no rales.  Abdominal: Soft. Bowel sounds are normal. He exhibits no distension. There is no abdominal tenderness.  Musculoskeletal:        General: No edema.     Cervical back: Neck supple.  Neurological: He is alert and oriented to person,  place, and time. He has intact cranial nerves (2-12).  Skin: Skin is warm and moist.   CARDIAC DATABASE: EKG: 01/26/2023: Bradycardia, 47 bpm, without underlying ischemia injury pattern.  No significant change compared to 01/25/2022.  Echocardiogram: 07/24/2019: LVEF 60-65%, septal basal hypertrophy, normal diastolic filling pattern, mild MR, mild TR, no pulmonary hypertension, mild PR.  01/19/2023: Normal LV systolic function with visual EF 55-60%. Left ventricle cavity is normal in size. Normal left ventricular wall thickness. Normal global wall motion. Normal diastolic filling pattern, elevated LAP. Presence of a septal bulge. Trace aortic regurgitation. Mild (Grade I) mitral regurgitation. Mild tricuspid regurgitation. No evidence of pulmonary hypertension. Compared to 07/24/2019 no significant change.  Stress Testing:  Lexiscan/Modified Bruce protocol Tetrofosmin stress test 07/28/2019:  Stress EKG is non-diagnostic, as this is pharmacological stress test. Additionally, patient exercised on the Modified Bruce protocol. Rest and stress EKG at 70% MPHR shows sinus rhythm, 1mm horizontal ST depression in inferolateral leads.  Small area of mild intensity perfusion defect in inferior myocardium. Stress LVEF 64%. Low risk study.  Cardiac CTA 7/14: Ca score 684 (97th percentile for age and sex matched controls), possible > 50% calcific stenosis in the mid LAD and distal RCA.    Heart Catheterization: 7/17/14at [facility] per EMR:   dLM 40%, oLAD 50%, mLAD 50%, oCFX 30-40%, pRCA 30-50%, posterior AV segment ostial 50%, EF 60-70% (vigorous LV function).   LABORATORY DATA: External Labs: Collected: July 2020 Creatinine 1.31 mg/dL. eGFR: 54 mL/min per 1.73 m Lipid profile: Total cholesterol 125, triglycerides 106, HDL 39, LDL 67 Hemoglobin W0J: 5.6  External Labs: Collected: 12/02/2021 Total cholesterol 110, triglycerides 53, HDL 47, LDL 52, non-HDL 63. Sodium 138, potassium 4.6, chloride 104, bicarb 35, BUN 22, creatinine 1.1. AST 37, ALT 29, alkaline phosphatase 76 Hemoglobin 15.1 g/dL, hematocrit 81.1%. TSH 2.5. A1c 5.5  External Labs: Collected: 12/27/2022 provided by the patient performed at [facility]. Total cholesterol 122, triglycerides 63, HDL 45, LDL 64, non-HDL 77. BUN 22, creatinine 1.3. Sodium 137, potassium 5, chloride 101, bicarb 30. AST 38, ALT 31, alkaline phosphatase 74. Hemoglobin 15.3 g/dL. TSH 2.63. A1c 5.6. Apolipoprotein B 65 (within normal limits.)   IMPRESSION:    ICD-10-CM   1. Nonobstructive atherosclerosis of coronary artery  I25.10 EKG 12-Lead    2. Essential hypertension  I10     3. Type 2 diabetes mellitus with other circulatory complication, without long-term current use of insulin (HCC)  E11.59     4. OSA (obstructive sleep apnea)  G47.33        RECOMMENDATIONS: MARKECE PIZZIMENTI is a 77 y.o. male whose  past medical history and cardiac risk factors include: Hypertension, hyperlipidemia, nonobstructive coronary artery disease, non-insulin-dependent diabetes mellitus type 2, sleep apnea on CPAP machine, former smoker, advanced age.  Nonobstructive atherosclerosis of coronary artery Denies anginal chest pain. No change in overall functional capacity. EKG is nonischemic. Most recent echocardiogram notes preserved LVEF with stable/mild valvular heart disease. Lipids and A1c are within acceptable limits. Reemphasized the importance of secondary  prevention with focus on improving her modifiable cardiovascular risk factors such as glycemic control, lipid management, blood pressure control, weight loss.  Essential hypertension Repeat blood pressure is within acceptable limits. Home blood pressures are usually between 120-130 mmHg on current medical therapy. Reemphasized importance of low-salt diet. No changes warranted at this time.  Type 2 diabetes mellitus with other circulatory complication, without long-term current use of insulin (HCC) Hemoglobin A1c is within acceptable limits. Continue statin  therapy.  OSA (obstructive sleep apnea) Reemphasized the importance of CPAP compliance.  Medications Discontinued During This Encounter  Medication Reason   amoxicillin (AMOXIL) 500 MG capsule     No orders of the defined types were placed in this encounter.   Current Outpatient Medications:    aspirin 81 MG tablet, Take 81 mg by mouth daily. , Disp: , Rfl:    atorvastatin (LIPITOR) 20 MG tablet, Take 1 tablet (20 mg total) by mouth daily at 6 PM., Disp: 90 tablet, Rfl: 3   azelastine (ASTELIN) 0.1 % nasal spray, 1-2 puffs each nostril once or twice daily when needed, Disp: 90 mL, Rfl: prn   cetirizine (ZYRTEC) 10 MG tablet, Take 10 mg by mouth as needed for allergies., Disp: , Rfl:    finasteride (PROSCAR) 5 MG tablet, Take 5 mg by mouth daily. , Disp: , Rfl:    fluticasone (FLONASE) 50 MCG/ACT nasal spray, 1-2 puffs each nostril once daily at bedtime (Patient taking differently: as needed. 1-2 puffs each nostril once daily at bedtime), Disp: 16 g, Rfl: prn   hydrochlorothiazide (HYDRODIURIL) 25 MG tablet, Take 25 mg by mouth daily. , Disp: , Rfl:    IBUPROFEN PO, Take by mouth as needed. , Disp: , Rfl:    Lactase (LACTOSE FAST ACTING RELIEF PO), Take 1 tablet by mouth 2 (two) times daily as needed. take with dairy products , Disp: , Rfl:    metFORMIN (GLUCOPHAGE) 500 MG tablet, Take 500 mg by mouth daily. , Disp: , Rfl:     metoprolol succinate (TOPROL-XL) 25 MG 24 hr tablet, TAKE 1 TABLET IN THE MORNING, TAKE WITH OR IMMEDIATELY FOLLOWING A MEAL, Disp: 180 tablet, Rfl: 3   Multiple Vitamin (MULTIVITAMIN) tablet, Take 1 tablet by mouth daily. , Disp: , Rfl:    olmesartan (BENICAR) 5 MG tablet, Take 5 mg by mouth daily., Disp: , Rfl:    omeprazole (PRILOSEC) 20 MG capsule, Take 20 mg by mouth daily. , Disp: , Rfl:    Tamsulosin HCl (FLOMAX) 0.4 MG CAPS, Take 0.4 mg by mouth 2 (two) times daily. , Disp: , Rfl:    TRUE METRIX BLOOD GLUCOSE TEST test strip, daily., Disp: , Rfl:    TRUEplus Lancets 28G MISC, daily., Disp: , Rfl:   Orders Placed This Encounter  Procedures   EKG 12-Lead   --Continue cardiac medications as reconciled in final medication list. --Return in about 1 year (around 01/26/2024) for Follow up, Coronary artery calcification. Or sooner if needed. --Continue follow-up with your primary care physician regarding the management of your other chronic comorbid conditions.  Patient's questions and concerns were addressed to his satisfaction. He voices understanding of the instructions provided during this encounter.   This note was created using a voice recognition software as a result there may be grammatical errors inadvertently enclosed that do not reflect the nature of this encounter. Every attempt is made to correct such errors.  Tessa Lerner, [place], [facility]  Pager: [phone] Office: [phone]

## 2023-05-13 NOTE — Progress Notes (Unsigned)
HPI M former smoker, attorney, followed for OSA, complicated by allergic rhinitis NPSG 07/15/06- AHI 36.7/ hr  -------------------------------------------------------------------------------------------   05/11/22-  76 yoM former light smoker, attorney, followed for OSA, complicated by Allergic Rhinitis, GERD, CAD, HTN, DM2,  CPAP auto 5-20/Adapt Download- compliance  100%, AHI 4.7/ hr Body weight today-178 lbs Covid vax-4 Phizer, 2 Moderna Flu vax-had -----Pt is doing well Download reviewed. Chin strap helps. Better off with CPAP. Otherwise breathing has been good. CXR 07/04/21- IMPRESSION:  Mild cardiomegaly.  No active lung disease.   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  History of Present Illness   The patient, with a history of sleep apnea and diabetes, presents for a routine follow-up. He reports good compliance with his CPAP machine, using it every night. He has been experiencing some discomfort due to the chin strap he uses in conjunction with the CPAP machine, which tends to itch and degrade quickly. 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 The patient also mentions a long-standing issue with the CPAP machine getting in the way at times, but overall, he has committed to its use and does not find it to be a significant problem.  The patient has also been managing his diabetes and has lost significant weight since his diagnosis in 2017, going from 211 lbs to 179 lbs. He expresses curiosity about new treatments for sleep apnea, specifically mentioning Wegovy, but does not express a desire to change his current treatment plan.     ROS-see HPI   + = positive Constitutional:    No-   weight loss, night sweats, fevers, chills, fatigue, lassitude. HEENT:   No-  headaches, difficulty swallowing, tooth/dental problems, sore throat,       No-  sneezing, itching, ear ache, + nasal congestion, +post nasal drip,  CV:  No-   chest pain, orthopnea, PND, swelling in lower extremities, anasarca, dizziness, palpitations Resp: No-   shortness of breath with exertion or at rest.              No-   productive cough,  No non-productive cough,  No- coughing up of blood.              No-   change in color of mucus.  No- wheezing.   Skin: No-   rash or lesions. GI:  No-   heartburn, indigestion, abdominal pain, nausea, vomiting,  GU:  MS:  No-   joint pain or swelling.  . Neuro-     nothing unusual Psych:  No- change in mood or affect. No depression or anxiety.  No memory loss.  OBJ- Physical Exam General- Alert, Oriented, Affect-appropriate, Distress- none acute Skin- rash-none, lesions- none, excoriation- none Lymphadenopathy- none Head- atraumatic            Eyes- Gross vision intact, PERRLA, conjunctivae and secretions clear            Ears- Hearing, canals-normal            Nose- Clear, no-Septal dev, mucus, polyps, erosion, perforation             Throat- Mallampati IV , mucosa , drainage- none, tonsils- atrophic. Throat clearing again noted. Neck- flexible , trachea midline, no  stridor , thyroid nl, carotid no bruit Chest - symmetrical excursion , unlabored           Heart/CV- RRR , no murmur , no gallop  , no rub, nl s1 s2                           - JVD- none , edema- none, stasis changes- none, varices- none, no carotid bruit heard           Lung- clear to P&A, wheeze- none, cough- none , dullness-none, rub- none           Chest wall-  Abd-  Br/ Gen/ Rectal- Not done, not indicated Extrem- cyanosis- none, clubbing, none, atrophy- none, strength- nl Neuro- grossly intact to observation  Assessment and Plan    Obstructive Sleep Apnea Well controlled with CPAP.  AHI under 5. No issues with sleep quality or breathing. Minor discomfort with chin strap. -Continue current CPAP settings. -Consider trying over-the-counter sleep tape or snore tape as an alternative to the chin strap. -Check with home care company regarding age of current CPAP machine and potential replacement options.  General Health Maintenance -Continue current management of diabetes with weight control. No current indication for new sleep apnea medications related to weight loss.

## 2023-05-14 ENCOUNTER — Encounter: Payer: Self-pay | Admitting: Internal Medicine

## 2023-05-14 ENCOUNTER — Ambulatory Visit: Payer: Medicare Other | Admitting: Internal Medicine

## 2023-05-14 VITALS — BP 123/70 | HR 55 | Temp 98.4°F | Resp 18 | Ht 65.5 in | Wt 179.8 lb

## 2023-05-14 DIAGNOSIS — G4733 Obstructive sleep apnea (adult) (pediatric): Secondary | ICD-10-CM

## 2023-05-14 NOTE — Patient Instructions (Signed)
We can continue CPAP auto 5-20  Please call if we can help 

## 2024-01-24 ENCOUNTER — Encounter: Payer: Self-pay | Admitting: Cardiology

## 2024-01-24 ENCOUNTER — Ambulatory Visit: Attending: Cardiology | Admitting: Cardiology

## 2024-01-24 VITALS — BP 128/78 | HR 54 | Resp 16 | Ht 65.0 in | Wt 176.2 lb

## 2024-01-24 DIAGNOSIS — I10 Essential (primary) hypertension: Secondary | ICD-10-CM | POA: Insufficient documentation

## 2024-01-24 DIAGNOSIS — G4733 Obstructive sleep apnea (adult) (pediatric): Secondary | ICD-10-CM | POA: Diagnosis present

## 2024-01-24 DIAGNOSIS — E1159 Type 2 diabetes mellitus with other circulatory complications: Secondary | ICD-10-CM | POA: Diagnosis present

## 2024-01-24 DIAGNOSIS — I2510 Atherosclerotic heart disease of native coronary artery without angina pectoris: Secondary | ICD-10-CM | POA: Diagnosis not present

## 2024-01-24 DIAGNOSIS — Z87891 Personal history of nicotine dependence: Secondary | ICD-10-CM | POA: Diagnosis present

## 2024-01-24 NOTE — Progress Notes (Signed)
 Cardiology Office Note:  .   Date:  01/24/2024  ID:  Harry Leach, DOB [birth year], MRN [id number] PCP:  Valentin Skates, DO  Former Cardiology Providers: None [facility] Providers Cardiologist:  Madonna Large, DO , [facility]  Electrophysiologist:  None  Click to update primary MD,subspecialty MD or APP then REFRESH:1}    Chief Complaint  Patient presents with   Nonobstructive atherosclerosis of coronary artery   Follow-up    History of Present Illness: Harry   Harry Leach is a 78 y.o.  male whose past medical history and cardiovascular risk factors includes: Hypertension, hyperlipidemia, nonobstructive coronary artery disease, non-insulin-dependent diabetes mellitus type 2, sleep apnea on CPAP machine, former smoker, advanced age.   Patient is being followed in the practice given his history of coronary artery calcification and nonobstructive coronary disease.  Since last office visit patient denies any anginal chest pain or heart failure symptoms.  No hospitalizations or urgent care visits for cardiovascular reasons.  He has been compliant with his medical therapy.  Physical endurance remains stable, continues to play tennis 3-5 times per week.  Does not check his home blood pressures regularly but when checked usually well-controlled, this morning 135/73 mmHg. He still continues to play tennis on a regular basis 3-4 times a week.    In the interim Toprol -XL has been reduced from 25 mg p.o. daily to 12.5 mg p.o. daily.  Patient does not have any symptoms of bradycardia.  But daytime pulse based on his smart watch is less than 60 bpm.  Review of Systems: .   Review of Systems  Cardiovascular:  Negative for chest pain, claudication, irregular heartbeat, leg swelling, near-syncope, orthopnea, palpitations, paroxysmal nocturnal dyspnea and syncope.  Respiratory:  Negative for shortness of breath.   Hematologic/Lymphatic: Negative for bleeding problem.    Studies Reviewed:    EKG: EKG Interpretation Date/Time:  Thursday January 24 2024 11:02:57 EDT Ventricular Rate:  52 PR Interval:  162 QRS Duration:  78 QT Interval:  436 QTC Calculation: 405 R Axis:   68  Text Interpretation: Sinus bradycardia with Premature atrial complexes Possible Anterior infarct , age undetermined No previous ECGs available Confirmed by Large Madonna [phone]) on 01/24/2024 11:08:49 AM  Echocardiogram: 01/19/2023: Normal LV systolic function with visual EF 55-60%. Left ventricle cavity is normal in size. Normal left ventricular wall thickness. Normal global wall motion. Normal diastolic filling pattern, elevated LAP. Presence of a septal bulge. Trace aortic regurgitation. Mild (Grade I) mitral regurgitation. Mild tricuspid regurgitation. No evidence of pulmonary hypertension. Compared to 07/24/2019 no significant change.   Stress Testing: Lexiscan /Modified Bruce protocol Tetrofosmin stress test 07/28/2019: Low risk study.   Cardiac CTA 7/14: Ca score 684 (97th percentile for age and sex matched controls), possible > 50% calcific stenosis in the mid LAD and distal RCA.     Heart Catheterization: 7/17/14at [facility] per EMR:  dLM 40%, oLAD 50%, mLAD 50%, oCFX 30-40%, pRCA 30-50%, posterior AV segment ostial 50%, EF 60-70% (vigorous LV function).   RADIOLOGY: Leach  Risk Assessment/Calculations:   Leach   Labs:    External Labs: Collected: July 2020 Lipid profile: Total cholesterol 125, triglycerides 106, HDL 39, LDL 67   External Labs: Collected: 12/02/2021 Total cholesterol 110, triglycerides 53, HDL 47, LDL 52, non-HDL 63.   External Labs: Collected: 12/27/2022 provided by the patient performed at [facility]. Total cholesterol 122, triglycerides 63, HDL 45, LDL 64, non-HDL 77. Apolipoprotein B 65 (within normal limits.)  External Labs: Collected:  01/02/2024 provided by patient performed at PCP. Total cholesterol 113, triglycerides 75, HDL 39, LDL 59,  non-HDL 74. BUN 33, creatinine 1.1. eGFR 65. Sodium 137, potassium 4.3, chloride 100, bicarb 33. AST, ALT, alkaline phosphatase within normal limits. Hemoglobin 14.5 g/dL. Apolipoprotein B 62, within normal limits. A1c 5.4  Physical Exam:    Today's Vitals   01/24/24 1100 01/24/24 1120  BP: (!) 150/80 128/78  Pulse: (!) 52 (!) 54  Resp: 16   SpO2: 95%   Weight: 176 lb 3.2 oz (79.9 kg)   Height: 5' 5 (1.651 m)    Body mass index is 29.32 kg/m. Wt Readings from Last 3 Encounters:  01/24/24 176 lb 3.2 oz (79.9 kg)  05/14/23 179 lb 12.8 oz (81.6 kg)  01/26/23 179 lb (81.2 kg)    Physical Exam  Constitutional: No distress.  hemodynamically stable  Neck: No JVD present.  Cardiovascular: Normal rate, regular rhythm, S1 normal, S2 normal, intact distal pulses and normal pulses. Exam reveals no gallop, no S3 and no S4.  No murmur heard. Pulmonary/Chest: Effort normal and breath sounds normal. No stridor. He has no wheezes. He has no rales.  Abdominal: Soft. Bowel sounds are normal. He exhibits no distension. There is no abdominal tenderness.  Musculoskeletal:        General: No edema.     Cervical back: Neck supple.  Neurological: He is alert and oriented to person, place, and time. He has intact cranial nerves (2-12).  Skin: Skin is warm and moist.     Impression & Recommendation(s):  Impression:   ICD-10-CM   1. Nonobstructive atherosclerosis of coronary artery  I25.10 EKG 12-Lead    2. Essential hypertension  I10     3. Type 2 diabetes mellitus with other circulatory complication, without long-term current use of insulin (HCC)  E11.59     4. OSA (obstructive sleep apnea)  G47.33     5. Former smoker  Z87.891        Recommendation(s):  Nonobstructive atherosclerosis of coronary artery Denies anginal chest pain. Continue aspirin 81 mg p.o. daily. Continue Lipitor 20 mg p.o. nightly. Outside labs provided by PCP reviewed and noted above for reference.  LDL and  triglycerides are well-controlled. Overall functional capacity remains stable. Reemphasized the importance of improving his modifiable cardiovascular risk factors for secondary prevention  Essential hypertension Initial blood pressures were elevated. Blood pressures were rechecked towards the end of the visit at which time they had normalized. Home blood pressures also seem to be within acceptable limits. Continue hydrochlorothiazide 25 mg p.o. daily. Continue Benicar 5 mg p.o. daily. Currently on Toprol -XL 12.5 mg p.o. daily, will discontinue to avoid bradycardia especially at night.  If additional blood pressure medications are warranted would recommend uptitrating Benicar. Blood pressures are currently being managed by primary team  Type 2 diabetes mellitus with other circulatory complication, without long-term current use of insulin (HCC) Hemoglobin A1c is very well-controlled. Continue ARB, statin therapy, metformin  Orders Placed:  Orders Placed This Encounter  Procedures   EKG 12-Lead     Final Medication List:   No orders of the defined types were placed in this encounter.   Medications Discontinued During This Encounter  Medication Reason   metoprolol  succinate (TOPROL -XL) 25 MG 24 hr tablet Discontinued by provider     Current Outpatient Medications:    aspirin 81 MG tablet, Take 81 mg by mouth daily. , Disp: , Rfl:    atorvastatin  (LIPITOR) 20 MG tablet, Take 1 tablet (  20 mg total) by mouth daily at 6 PM., Disp: 90 tablet, Rfl: 3   azelastine  (ASTELIN ) 0.1 % nasal spray, 1-2 puffs each nostril once or twice daily when needed, Disp: 90 mL, Rfl: prn   cetirizine (ZYRTEC) 10 MG tablet, Take 10 mg by mouth as needed for allergies., Disp: , Rfl:    finasteride  (PROSCAR ) 5 MG tablet, Take 5 mg by mouth daily. , Disp: , Rfl:    fluticasone  (FLONASE ) 50 MCG/ACT nasal spray, 1-2 puffs each nostril once daily at bedtime (Patient taking differently: as needed. 1-2 puffs each  nostril once daily at bedtime), Disp: 16 g, Rfl: prn   hydrochlorothiazide (HYDRODIURIL) 25 MG tablet, Take 25 mg by mouth daily. , Disp: , Rfl:    IBUPROFEN PO, Take by mouth as needed. , Disp: , Rfl:    Lactase (LACTOSE FAST ACTING RELIEF PO), Take 1 tablet by mouth 2 (two) times daily as needed. take with dairy products , Disp: , Rfl:    metFORMIN (GLUCOPHAGE) 500 MG tablet, Take 500 mg by mouth daily. , Disp: , Rfl:    Multiple Vitamin (MULTIVITAMIN) tablet, Take 1 tablet by mouth daily. , Disp: , Rfl:    olmesartan (BENICAR) 5 MG tablet, Take 5 mg by mouth daily., Disp: , Rfl:    omeprazole  (PRILOSEC) 20 MG capsule, Take 20 mg by mouth daily. , Disp: , Rfl:    Tamsulosin  HCl (FLOMAX ) 0.4 MG CAPS, Take 0.4 mg by mouth 2 (two) times daily. , Disp: , Rfl:    TRUE METRIX BLOOD GLUCOSE TEST test strip, daily., Disp: , Rfl:    TRUEplus Lancets 28G MISC, daily., Disp: , Rfl:   Consent:   Leach  Disposition:   1 year follow-up sooner if needed  His questions and concerns were addressed to his satisfaction. He voices understanding of the recommendations provided during this encounter.    Signed, Madonna Michele HAS, [facility]  A Division of [facility] [address], [place], [place] [address]  01/24/2024 12:32 PM

## 2024-01-24 NOTE — Patient Instructions (Signed)
 Medication Instructions:  Your physician has recommended you make the following change in your medication:  STOP Metoprolol  Succinate (Toprol )  *If you need a refill on your cardiac medications before your next appointment, please call your pharmacy*  Lab Work: None ordered  If you have any lab test that is abnormal or we need to change your treatment, we will call you to review the results.  Testing/Procedures: None ordered  Follow-Up: At [facility], you and your health needs are our priority.  As part of our continuing mission to provide you with exceptional heart care, our providers are all part of one team.  This team includes your primary Cardiologist (physician) and Advanced Practice Providers or APPs (Physician Assistants and Nurse Practitioners) who all work together to provide you with the care you need, when you need it.  Your next appointment:   1 year(s)  Provider:   Madonna Large, DO    Thank you for choosing [facility]!!   [phone]

## 2024-01-29 ENCOUNTER — Telehealth: Payer: Self-pay | Admitting: Cardiology

## 2024-01-29 NOTE — Telephone Encounter (Signed)
 Patient expressed concern with EKG interpretation reading Possible anterior infarct, age undetermined.  He notes this has been on all of his EKGs since 2016 and no one has really explained this to him. He would like to know, in simple terms, what this means and if there is anything he should be concerned about.   He has read this can mean he has had a heart attack in the past, but again this has never been discussed with him.

## 2024-01-29 NOTE — Telephone Encounter (Signed)
 EKGs are not always accurate when evaluating old infarcts/heart attacks. But for completeness EKG readers have to report the findings w/ words such as possible/consider. He has had a heart catheterization in the past which noted disease in the LAD distribution, and artery that supplies the front part of the heart.  And his most recent stress test was low risk.  His functional capacity is excellent for his age. No additional workup is warranted at this time. We can discuss at the next office visit.   Dr. Adean Milosevic

## 2024-01-29 NOTE — Telephone Encounter (Signed)
 Patient calling to in regards to the results on his EKG. Please advise

## 2024-01-30 NOTE — Telephone Encounter (Signed)
 Spoke with pt. Gave pt information Dr Michele stated. Pt stated understanding and thanked for call.

## 2024-05-12 NOTE — Progress Notes (Signed)
 HPI M former smoker, attorney, followed for OSA, complicated by allergic rhinitis NPSG 07/15/06- AHI 36.7/ hr  -------------------------------------------------------------------------------------------   05/11/22-  76 yoM former light smoker, attorney, followed for OSA, complicated by Allergic Rhinitis, GERD, CAD, HTN, DM2,  CPAP auto 5-20/[employer] Download- compliance  100%, AHI 4.7/ hr Body weight today-178 lbs Covid vax-4 Phizer, 2 Moderna Flu vax-had -----Pt is doing well Download reviewed. Chin strap helps. Better off with CPAP. Otherwise breathing has been good. CXR 07/04/21- IMPRESSION:  Mild cardiomegaly.  No active lung disease.   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  History of Present Illness   The patient, with a history of sleep apnea and diabetes, presents for a routine follow-up. He reports good compliance with his CPAP machine, using it every night. He has been experiencing some discomfort due to the chin strap he uses in conjunction with the CPAP machine, which tends to itch and degrade quickly. 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 The patient also mentions a long-standing issue with the CPAP machine getting in the way at times, but overall, he has committed to its use and does not find it to be a significant problem.  The patient has also been managing his diabetes and has lost significant weight since his diagnosis in 2017, going from 211 lbs to 179 lbs. He expresses curiosity about new treatments for sleep apnea, specifically mentioning Wegovy, but does not express a desire to change his current treatment plan.   Assessment and Plan:    Obstructive Sleep  Apnea Well controlled with CPAP. AHI under 5. No issues with sleep quality or breathing. Minor discomfort with chin strap. -Continue current CPAP settings. -Consider trying over-the-counter sleep tape or snore tape as an alternative to the chin strap. -Check with home care company regarding age of current CPAP machine and potential replacement options.  General Health Maintenance -Continue current management of diabetes with weight control. No current indication for new sleep apnea medications related to weight loss.    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 Discussed the use of AI scribe software for clinical note transcription with the patient, who gave verbal consent to proceed.  History of Present Illness   Sorin Frimpong is a 78 year old male who presents with difficulty sleeping.  He has difficulty sleeping that he does not attribute to CPAP. He typically goes to bed at 1:30 to 2:00 AM and wakes around 8:30 AM, then lies in bed unable to return to sleep.  He naps during the day, especially after playing tennis 3 to 4 times a week. Naps last 40 to 60 minutes, and he estimates total sleep time including naps is over 7 hours per day. He sometimes falls asleep unintentionally in the evening and then has trouble returning to sleep once awake.  He wakes at night to urinate and from shoulder pain, and often wakes before his alarm and cannot fall back asleep.  He uses azelastine  for nasal symptoms and has not used  sleep aids. He does not want more meds.     Assessment and Plan    Obstructive sleep apnea- benefits from CPAP Managed with CPAP. Sleep issues not attributed to CPAP. Discussed melatonin and potential future use of short-acting sleep  medications. - Continue CPAP therapy. - Consider over-the-counter melatonin. - Discuss short-acting sleep medications if needed.  Chronic rhinitis Managed with azelastine  nasal spray. - Refilled azelastine  nasal spray prescription at [facility]     ROS-see HPI   + = positive Constitutional:   No-   weight loss, night sweats, fevers, chills, fatigue, lassitude. HEENT:   No-  headaches, difficulty swallowing, tooth/dental problems, sore throat,       No-  sneezing, itching, ear ache, + nasal congestion, +post nasal drip,  CV:  No-   chest pain, orthopnea, PND, swelling in lower extremities, anasarca, dizziness, palpitations Resp: No-   shortness of breath with exertion or at rest.              No-   productive cough,  No non-productive cough,  No- coughing up of blood.              No-   change in color of mucus.  No- wheezing.   Skin: No-   rash or lesions. GI:  No-   heartburn, indigestion, abdominal pain, nausea, vomiting,  GU:  MS:  No-   joint pain or swelling.  . Neuro-     nothing unusual Psych:  No- change in mood or affect. No depression or anxiety.  No memory loss.  OBJ- Physical Exam General- Alert, Oriented, Affect-appropriate, Distress- none acute Skin- rash-none, lesions- none, excoriation- none Lymphadenopathy- none Head- atraumatic            Eyes- Gross vision intact, PERRLA, conjunctivae and secretions clear            Ears- Hearing, canals-normal            Nose- Clear, no-Septal dev, mucus, polyps, erosion, perforation             Throat- Mallampati IV , mucosa , drainage- none, tonsils- atrophic. Throat clearing again noted. Neck- flexible , trachea midline, no stridor , thyroid nl, carotid no bruit Chest - symmetrical excursion , unlabored           Heart/CV- RRR , no murmur , no gallop  , no rub, nl s1 s2                           - JVD- none , edema- none, stasis changes- none, varices- none, no carotid bruit heard           Lung- clear to P&A,  wheeze- none, cough- none , dullness-none, rub- none           Chest wall-  Abd-  Br/ Gen/ Rectal- Not done, not indicated Extrem- cyanosis- none, clubbing, none, atrophy- none, strength- nl Neuro- grossly intact to observation

## 2024-05-13 ENCOUNTER — Ambulatory Visit (INDEPENDENT_AMBULATORY_CARE_PROVIDER_SITE_OTHER): Payer: BLUE CROSS/BLUE SHIELD | Admitting: Internal Medicine

## 2024-05-13 ENCOUNTER — Encounter: Payer: Self-pay | Admitting: Internal Medicine

## 2024-05-13 VITALS — BP 134/62 | HR 62 | Ht 66.0 in | Wt 177.6 lb

## 2024-05-13 DIAGNOSIS — J310 Chronic rhinitis: Secondary | ICD-10-CM | POA: Diagnosis not present

## 2024-05-13 DIAGNOSIS — G4733 Obstructive sleep apnea (adult) (pediatric): Secondary | ICD-10-CM | POA: Diagnosis not present

## 2024-05-13 DIAGNOSIS — Z87891 Personal history of nicotine dependence: Secondary | ICD-10-CM

## 2024-05-13 MED ORDER — AZELASTINE HCL 0.1 % NA SOLN: NASAL | 99 refills | Status: AC

## 2024-05-13 NOTE — Patient Instructions (Addendum)
 You are doing well with CCPAP, we cann continue auto 5-20, mask of choice, humidifier, supplies, AirView/ card  Script sent refilling azelastine  nasal spray  You might want to try otc melatonin- 3-6 mg a little for bedtime, might make it a little easier to sleep.  At check out, ask the front desk to bring you back next year with one of our sleep doctors.

## 2024-05-14 ENCOUNTER — Telehealth: Payer: Self-pay

## 2024-05-14 ENCOUNTER — Other Ambulatory Visit: Payer: Self-pay

## 2024-05-14 DIAGNOSIS — G4733 Obstructive sleep apnea (adult) (pediatric): Secondary | ICD-10-CM

## 2024-05-14 NOTE — Telephone Encounter (Signed)
 Pt we discussed.  Thanks

## 2024-05-14 NOTE — Telephone Encounter (Addendum)
 Copied from CRM #[id number]. Topic: Clinical - Order For Equipment >> May 13, 2024  4:59 PM Rilla B wrote: Reason for CRM: Patient states [employer] needs an new order for supplies.  Any questions, please call patient at [phone].  Order placed for CPAP supplies. DOD please sign. Thanks Patient was made aware. Nfn

## 2024-05-16 NOTE — Telephone Encounter (Signed)
 NFN

## 2024-05-21 ENCOUNTER — Encounter: Payer: Self-pay | Admitting: Internal Medicine
